# Patient Record
Sex: Male | Born: 1990 | Race: White | Hispanic: No | Marital: Single | Smoking: Current every day smoker
Health system: Southern US, Community
[De-identification: ages and names within clinical notes are randomized; demographics above are authoritative.]

## PROBLEM LIST (undated history)

## (undated) DIAGNOSIS — S62309A Unspecified fracture of unspecified metacarpal bone, initial encounter for closed fracture: Secondary | ICD-10-CM

## (undated) DIAGNOSIS — J45909 Unspecified asthma, uncomplicated: Secondary | ICD-10-CM

## (undated) DIAGNOSIS — F32A Depression, unspecified: Secondary | ICD-10-CM

## (undated) DIAGNOSIS — L409 Psoriasis, unspecified: Secondary | ICD-10-CM

## (undated) DIAGNOSIS — F419 Anxiety disorder, unspecified: Secondary | ICD-10-CM

## (undated) DIAGNOSIS — F329 Major depressive disorder, single episode, unspecified: Secondary | ICD-10-CM

## (undated) HISTORY — PX: TONSILLECTOMY: SUR1361

---
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
## 2008-07-24 ENCOUNTER — Observation Stay (HOSPITAL_COMMUNITY): Admission: EM | Admit: 2008-07-24 | Discharge: 2008-07-24 | Payer: Self-pay | Admitting: Emergency Medicine

## 2008-07-25 ENCOUNTER — Emergency Department (HOSPITAL_COMMUNITY): Admission: EM | Admit: 2008-07-25 | Discharge: 2008-07-25 | Payer: Self-pay | Admitting: Emergency Medicine

## 2009-10-30 ENCOUNTER — Emergency Department (HOSPITAL_COMMUNITY): Admission: EM | Admit: 2009-10-30 | Discharge: 2009-10-30 | Payer: Self-pay | Admitting: Emergency Medicine

## 2010-01-15 ENCOUNTER — Emergency Department (HOSPITAL_COMMUNITY): Admission: EM | Admit: 2010-01-15 | Discharge: 2010-01-15 | Payer: Self-pay | Admitting: Emergency Medicine
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
## 2011-12-16 ENCOUNTER — Emergency Department (HOSPITAL_COMMUNITY): Payer: Self-pay

## 2011-12-16 ENCOUNTER — Emergency Department (HOSPITAL_COMMUNITY)
Admission: EM | Admit: 2011-12-16 | Discharge: 2011-12-16 | Disposition: A | Discharge status: Home / Self Care | Payer: Self-pay | Attending: Emergency Medicine | Admitting: Emergency Medicine

## 2011-12-16 ENCOUNTER — Encounter (HOSPITAL_COMMUNITY): Payer: Self-pay | Admitting: Emergency Medicine

## 2011-12-16 DIAGNOSIS — J45901 Unspecified asthma with (acute) exacerbation: Secondary | ICD-10-CM | POA: Insufficient documentation

## 2011-12-16 HISTORY — DX: Unspecified asthma, uncomplicated: J45.909

## 2011-12-16 MED ORDER — HYDROCODONE-ACETAMINOPHEN 5-325 MG PO TABS: 2.0000 | ORAL_TABLET | Freq: Four times a day (QID) | ORAL | Status: DC | PRN

## 2011-12-16 MED ORDER — ALBUTEROL SULFATE (5 MG/ML) 0.5% IN NEBU
5.0000 mg | INHALATION_SOLUTION | Freq: Once | RESPIRATORY_TRACT | Status: AC
  Administered 2011-12-16: 5 mg via RESPIRATORY_TRACT

## 2011-12-16 MED ORDER — IPRATROPIUM BROMIDE 0.02 % IN SOLN
0.5000 mg | Freq: Once | RESPIRATORY_TRACT | Status: AC
  Administered 2011-12-16: 0.5 mg via RESPIRATORY_TRACT

## 2011-12-16 MED ORDER — ALBUTEROL SULFATE (2.5 MG/3ML) 0.083% IN NEBU: 2.5000 mg | INHALATION_SOLUTION | RESPIRATORY_TRACT | Status: DC | PRN

## 2011-12-16 MED ORDER — PREDNISONE 20 MG PO TABS: ORAL_TABLET | ORAL | Status: DC

## 2011-12-16 MED ORDER — BUDESONIDE 0.5 MG/2ML IN SUSP: 0.5000 mg | Freq: Two times a day (BID) | RESPIRATORY_TRACT | Status: DC

## 2011-12-16 MED ORDER — IPRATROPIUM BROMIDE 0.02 % IN SOLN
RESPIRATORY_TRACT | Status: AC
  Administered 2011-12-16: 0.5 mg via RESPIRATORY_TRACT
  Filled 2011-12-16: qty 2.5

## 2011-12-16 MED ORDER — ALBUTEROL SULFATE (5 MG/ML) 0.5% IN NEBU
INHALATION_SOLUTION | RESPIRATORY_TRACT | Status: AC
  Administered 2011-12-16: 5 mg via RESPIRATORY_TRACT
  Filled 2011-12-16: qty 1

## 2011-12-16 MED ORDER — ALBUTEROL SULFATE HFA 108 (90 BASE) MCG/ACT IN AERS: 2.0000 | INHALATION_SPRAY | RESPIRATORY_TRACT | Status: DC | PRN

## 2011-12-16 NOTE — ED Notes (Signed)
Pt c/o increased SOB and  Cough with URI sx; pt sts some pain with cough

## 2011-12-16 NOTE — ED Provider Notes (Signed)
History   This chart was scribed for Hurman Horn, MD by Albertha Ghee Rifaie. This patient was seen in room TR10C/TR10C and the patient's care was started at 4:01PM.   CSN: [id number]  Arrival date & time 12/16/11  1331   None     Chief Complaint  Patient presents with  . Shortness of Breath  . Cough     The history is provided by the patient. No language interpreter was used.   [name] is a 21 y.o. male who presents to the Emergency Department complaining of several days of gradual onset, gradually worsening, sever SOB that is associated w/ Cp, cough, wheezing, and generalized body fatigue. Pt states having a h/o asthma. He states taking an inhaler that is minimally helping his symptoms and he ran out of it for a while now. He repots having an access to a nebulizer at home. Pt reports having these symptoms before but the CP is new. He denies fever, emesis, chills, and nausea. Pt is a current everyday smoker and occasional alcohol user.        Past Medical History  Diagnosis Date  . Asthma     History reviewed. No pertinent past surgical history.  History reviewed. No pertinent family history.  History  Substance Use Topics  . Smoking status: Current Every Day Smoker  . Smokeless tobacco: Not on file  . Alcohol Use: Yes      Review of Systems  10 Systems reviewed and all are negative for acute change except as noted in the HPI.  Allergies  Review of patient's allergies indicates no known allergies.  Home Medications   Current Outpatient Rx  Name Route Sig Dispense Refill  . ALBUTEROL SULFATE HFA 108 (90 BASE) MCG/ACT IN AERS Inhalation Inhale 2 puffs into the lungs every 6 (six) hours as needed. For asthma    . ALBUTEROL SULFATE HFA 108 (90 BASE) MCG/ACT IN AERS Inhalation Inhale 2 puffs into the lungs every 2 (two) hours as needed for wheezing or shortness of breath (cough). 1 Inhaler 0  . ALBUTEROL SULFATE (2.5 MG/3ML) 0.083% IN NEBU Nebulization Take 3  mLs (2.5 mg total) by nebulization every 4 (four) hours as needed for wheezing. 30 vial 0  . BUDESONIDE 0.5 MG/2ML IN SUSP Nebulization Take 2 mLs (0.5 mg total) by nebulization 2 (two) times daily. 60 mL 0  . HYDROCODONE-ACETAMINOPHEN 5-325 MG PO TABS Oral Take 2 tablets by mouth every 6 (six) hours as needed for pain. 10 tablet 0  . PREDNISONE 20 MG PO TABS  3 tabs po day one, then 2 po daily x 4 days 11 tablet 0    BP 112/63  Pulse 78  Temp 98.5 F (36.9 C) (Oral)  Resp 14  SpO2 97%  Physical Exam  Nursing note and vitals reviewed. Constitutional:       Awake, alert, nontoxic appearance.  HENT:  Head: Atraumatic.  Eyes: Right eye exhibits no discharge. Left eye exhibits no discharge.  Neck: Neck supple.  Cardiovascular: Normal rate and regular rhythm.   Pulmonary/Chest: Effort normal. He has wheezes. He exhibits no tenderness.       diffuse scatter expiratory wheezes   Abdominal: Soft. There is no tenderness. There is no rebound.  Musculoskeletal: He exhibits no tenderness.       Baseline ROM, no obvious new focal weakness.  Neurological:       Mental status and motor strength appears baseline for patient and situation.  Skin: No rash  noted.  Psychiatric: He has a normal mood and affect.    ED Course  Procedures (including critical care time)  Labs Reviewed - No data to display No results found. DIAGNOSTIC STUDIES: Oxygen Saturation is 97% on room air, adequate by my interpretation.    COORDINATION OF CARE: 4:01PM Patient / Family / Caregiver informed of clinical course, understand medical decision-making process, and agree with plan.     1. Asthma attack       MDM  I personally performed the services described in this documentation, which was scribed in my presence. The recorded information has been reviewed and considered.   Hurman Horn, MD 12/20/11 [phone]

## 2012-02-13 ENCOUNTER — Emergency Department (HOSPITAL_COMMUNITY): Payer: Self-pay

## 2012-02-13 ENCOUNTER — Other Ambulatory Visit: Payer: Self-pay | Admitting: Orthopedic Surgery

## 2012-02-13 ENCOUNTER — Encounter (HOSPITAL_COMMUNITY): Payer: Self-pay | Admitting: Emergency Medicine

## 2012-02-13 ENCOUNTER — Encounter (HOSPITAL_BASED_OUTPATIENT_CLINIC_OR_DEPARTMENT_OTHER): Payer: Self-pay | Admitting: *Deleted

## 2012-02-13 ENCOUNTER — Emergency Department (HOSPITAL_COMMUNITY)
Admission: EM | Admit: 2012-02-13 | Discharge: 2012-02-13 | Disposition: A | Discharge status: Home / Self Care | Payer: Self-pay | Attending: Emergency Medicine | Admitting: Emergency Medicine

## 2012-02-13 DIAGNOSIS — Y929 Unspecified place or not applicable: Secondary | ICD-10-CM | POA: Insufficient documentation

## 2012-02-13 DIAGNOSIS — S62308A Unspecified fracture of other metacarpal bone, initial encounter for closed fracture: Secondary | ICD-10-CM

## 2012-02-13 DIAGNOSIS — F17200 Nicotine dependence, unspecified, uncomplicated: Secondary | ICD-10-CM | POA: Insufficient documentation

## 2012-02-13 DIAGNOSIS — Y9389 Activity, other specified: Secondary | ICD-10-CM | POA: Insufficient documentation

## 2012-02-13 DIAGNOSIS — L408 Other psoriasis: Secondary | ICD-10-CM | POA: Insufficient documentation

## 2012-02-13 DIAGNOSIS — S62309A Unspecified fracture of unspecified metacarpal bone, initial encounter for closed fracture: Secondary | ICD-10-CM

## 2012-02-13 DIAGNOSIS — M7989 Other specified soft tissue disorders: Secondary | ICD-10-CM | POA: Insufficient documentation

## 2012-02-13 DIAGNOSIS — J45909 Unspecified asthma, uncomplicated: Secondary | ICD-10-CM | POA: Insufficient documentation

## 2012-02-13 DIAGNOSIS — Z79899 Other long term (current) drug therapy: Secondary | ICD-10-CM | POA: Insufficient documentation

## 2012-02-13 DIAGNOSIS — W010XXA Fall on same level from slipping, tripping and stumbling without subsequent striking against object, initial encounter: Secondary | ICD-10-CM | POA: Insufficient documentation

## 2012-02-13 DIAGNOSIS — S62319A Displaced fracture of base of unspecified metacarpal bone, initial encounter for closed fracture: Secondary | ICD-10-CM | POA: Insufficient documentation

## 2012-02-13 HISTORY — DX: Unspecified fracture of unspecified metacarpal bone, initial encounter for closed fracture: S62.309A

## 2012-02-13 HISTORY — DX: Psoriasis, unspecified: L40.9

## 2012-02-13 MED ORDER — HYDROCODONE-ACETAMINOPHEN 5-325 MG PO TABS
1.0000 | ORAL_TABLET | Freq: Once | ORAL | Status: AC
  Administered 2012-02-13: 1 via ORAL
  Filled 2012-02-13: qty 1

## 2012-02-13 MED ORDER — HYDROCODONE-ACETAMINOPHEN 5-325 MG PO TABS: 1.0000 | ORAL_TABLET | Freq: Four times a day (QID) | ORAL | Status: DC | PRN

## 2012-02-13 NOTE — ED Provider Notes (Signed)
History     CSN: [id number]  Arrival date & time 02/13/12  4098   First MD Initiated Contact with Patient 02/13/12 0757      Chief Complaint  Patient presents with  . Hand Injury    (Consider location/radiation/quality/duration/timing/severity/associated sxs/prior treatment) HPI Comments: 21 year old boy that is right-hand dominant presents emergency department complaining of right hand pain.  Patient reports an incident just prior to arrival that he slipped going down stairs with point of impact being on a closed fist, right hand.  Patient denies any numbness or tingling of the digits, inability to move fingers, or wrist.  Patient reports swelling and pain.  Denies blood loss at fall.  Symptoms are moderate.  No other complaints this time.  Patient is a 21 y.o. male presenting with hand injury.  Hand Injury  Pertinent negatives include no fever.    Past Medical History  Diagnosis Date  . Asthma   . Psoriasis     History reviewed. No pertinent past surgical history.  No family history on file.  History  Substance Use Topics  . Smoking status: Current Every Day Smoker -- 1.0 packs/day    Types: Cigarettes  . Smokeless tobacco: Not on file  . Alcohol Use: Yes      Review of Systems  Constitutional: Negative for fever, diaphoresis and activity change.  HENT: Negative for congestion and neck pain.   Respiratory: Negative for cough.   Genitourinary: Negative for dysuria.  Musculoskeletal: Negative for myalgias.  Skin: Negative for color change and wound.  Neurological: Negative for headaches.  All other systems reviewed and are negative.    Allergies  Review of patient's allergies indicates no known allergies.  Home Medications   Current Outpatient Rx  Name  Route  Sig  Dispense  Refill  . ACETAMINOPHEN 500 MG PO TABS   Oral   Take 1,000 mg by mouth every 6 (six) hours as needed. For pain/fever         . ALBUTEROL SULFATE HFA 108 (90 BASE) MCG/ACT IN  AERS   Inhalation   Inhale 2 puffs into the lungs every 2 (two) hours as needed. For shortness of breath         . ALBUTEROL SULFATE (2.5 MG/3ML) 0.083% IN NEBU   Nebulization   Take 2.5 mg by nebulization every 4 (four) hours as needed. For shortness of breath           BP 122/71  Pulse 70  Temp 97.8 F (36.6 C) (Oral)  Resp 18  Ht 5\' 6"  (1.676 m)  Wt 125 lb (56.7 kg)  BMI 20.18 kg/m2  SpO2 99%  Physical Exam  Nursing note and vitals reviewed. Constitutional: He appears well-developed and well-nourished. No distress.  HENT:  Head: Normocephalic and atraumatic.  Eyes: Conjunctivae normal and EOM are normal.  Neck: Normal range of motion. Neck supple.  Cardiovascular:       Intact distal pulses, capillary refill < 3 seconds  Musculoskeletal:       Pain w right hand finger movement. Bony ttp along 4th & 5th metacarpals. No small finger medial deviation in flexion. All other extremities with normal ROM  Neurological:       No sensory deficit  Skin: He is not diaphoretic.       Swelling over dorsal surface of metacarpal area, right hand. Skin intact, no tenting. Bruising on palmer surface    ED Course  Procedures (including critical care time)  Labs Reviewed - No data  to display Dg Wrist Complete Right  02/13/2012  *RADIOLOGY REPORT*  Clinical Data: Fall, medial wrist pain  RIGHT WRIST - COMPLETE 3+ VIEW  Comparison: None.  Findings: Fracture at the base of the fifth metacarpal.  No fracture or dislocation is seen in the wrist.  The joint spaces are preserved.  Possible mild soft tissue swelling along the dorsal wrist.  IMPRESSION: Fracture at the base of the fifth metacarpal.   Original Report Authenticated By: Charline Bills, M.D.    Dg Hand Complete Right  02/13/2012  *RADIOLOGY REPORT*  Clinical Data: Fall, right hand injury  RIGHT HAND - COMPLETE 3+ VIEW  Comparison: None.  Findings: Mildly displaced intra-articular fracture at the base of the fifth metacarpal.   No additional fracture is seen.  The joint spaces are preserved.  Possible mild soft tissue swelling along the medial hand.  IMPRESSION: Mildly displaced fracture at the base of the fifth metacarpal.   Original Report Authenticated By: Charline Bills, M.D.      No diagnosis found.  Consult: Hand- will call pt today to be evaluated for possible pinning to be done monday. Pt splinted in ER & dc w Rx for pain  MDM  %th metacarpal fracture, Mildly displaced 21 year old boy presented to ER after mechanical fall this morning with fifth metacarpal mildly displaced fracture.  Neurovascularly intact on exam.  Consult to hand as above.  Patient's pain managed well in the emergency department.  Splint applied.  Patient is to followup with orthopedics today outpatient appointment and evaluation for need of surgery.   At this time there does not appear to be any evidence of an acute emergency medical condition and the patient appears stable for discharge with appropriate outpatient follow up.Diagnosis was discussed with patient who verbalizes understanding and is agreeable to discharge. Pt case discussed with Dr. Adriana Simas who agrees with my plan.         Jaci Carrel, [place] 02/13/12 1037

## 2012-02-13 NOTE — Progress Notes (Signed)
Orthopedic Tech Progress Note Patient Details:  GAD AYMOND [birth year] [id number] Ulna-gutter splint applied to Right UE. Patient tolerated application well. Patient was asked upon application if splint had gotten too hot or if it was wrapped too tight and patient replied no. Ortho tech noticed that splint was mad a little long and stopped just above the elbow. Patient was asked if he would like splint to be cut shorter and he replied that it was ok where it is. Note that patient has full ROM on elbow joint and splint did not bother patient at bend of elbow. ER doctor present to speak with patient about setting up appointment with hand surgeon to evaluate. Ortho Devices Type of Ortho Device: Ulna gutter splint Ortho Device/Splint Location: Right UE Ortho Device/Splint Interventions: Application   Asia R Thompson 02/13/2012, 10:38 AM

## 2012-02-13 NOTE — ED Notes (Signed)
Patient states "slipped coming down steps and landed on hand.  I think I broke it".

## 2012-02-13 NOTE — ED Notes (Signed)
Ortho called to place splint. 

## 2012-02-16 ENCOUNTER — Encounter (HOSPITAL_BASED_OUTPATIENT_CLINIC_OR_DEPARTMENT_OTHER): Payer: Self-pay | Admitting: Certified Registered"

## 2012-02-16 ENCOUNTER — Encounter (HOSPITAL_BASED_OUTPATIENT_CLINIC_OR_DEPARTMENT_OTHER): Payer: Self-pay

## 2012-02-16 ENCOUNTER — Encounter (HOSPITAL_BASED_OUTPATIENT_CLINIC_OR_DEPARTMENT_OTHER)
Admission: RE | Disposition: A | Discharge status: Home / Self Care | Payer: Self-pay | Source: Ambulatory Visit | Attending: Orthopedic Surgery

## 2012-02-16 ENCOUNTER — Ambulatory Visit (HOSPITAL_BASED_OUTPATIENT_CLINIC_OR_DEPARTMENT_OTHER): Payer: Self-pay | Admitting: Certified Registered"

## 2012-02-16 ENCOUNTER — Ambulatory Visit (HOSPITAL_BASED_OUTPATIENT_CLINIC_OR_DEPARTMENT_OTHER)
Admission: RE | Admit: 2012-02-16 | Discharge: 2012-02-16 | Disposition: A | Discharge status: Home / Self Care | Payer: Self-pay | Source: Ambulatory Visit | Attending: Orthopedic Surgery | Admitting: Orthopedic Surgery

## 2012-02-16 DIAGNOSIS — S62309A Unspecified fracture of unspecified metacarpal bone, initial encounter for closed fracture: Secondary | ICD-10-CM

## 2012-02-16 DIAGNOSIS — F17200 Nicotine dependence, unspecified, uncomplicated: Secondary | ICD-10-CM | POA: Insufficient documentation

## 2012-02-16 DIAGNOSIS — R296 Repeated falls: Secondary | ICD-10-CM | POA: Insufficient documentation

## 2012-02-16 DIAGNOSIS — L408 Other psoriasis: Secondary | ICD-10-CM | POA: Insufficient documentation

## 2012-02-16 DIAGNOSIS — J45909 Unspecified asthma, uncomplicated: Secondary | ICD-10-CM | POA: Insufficient documentation

## 2012-02-16 DIAGNOSIS — S62233A Other displaced fracture of base of first metacarpal bone, unspecified hand, initial encounter for closed fracture: Secondary | ICD-10-CM | POA: Insufficient documentation

## 2012-02-16 HISTORY — DX: Anxiety disorder, unspecified: F41.9

## 2012-02-16 HISTORY — DX: Unspecified fracture of unspecified metacarpal bone, initial encounter for closed fracture: S62.309A

## 2012-02-16 HISTORY — PX: CLOSED REDUCTION FINGER WITH PERCUTANEOUS PINNING: SHX5612

## 2012-02-16 LAB — POCT HEMOGLOBIN-HEMACUE: Hemoglobin: 14.6 g/dL (ref 13.0–17.0)

## 2012-02-16 SURGERY — CLOSED REDUCTION, FINGER, WITH PERCUTANEOUS PINNING
Anesthesia: General | Laterality: Right

## 2012-02-16 MED ORDER — HYDROMORPHONE HCL PF 1 MG/ML IJ SOLN
0.2500 mg | INTRAMUSCULAR | Status: DC | PRN
Start: 2012-02-16 — End: 2012-02-16
  Administered 2012-02-16 (×3): 0.5 mg via INTRAVENOUS

## 2012-02-16 MED ORDER — DEXAMETHASONE SODIUM PHOSPHATE 4 MG/ML IJ SOLN
INTRAMUSCULAR | Status: DC | PRN
  Administered 2012-02-16: 8 mg via INTRAVENOUS

## 2012-02-16 MED ORDER — OXYCODONE HCL 5 MG PO TABS
5.0000 mg | ORAL_TABLET | Freq: Once | ORAL | Status: AC | PRN
  Administered 2012-02-16: 5 mg via ORAL

## 2012-02-16 MED ORDER — ONDANSETRON HCL 4 MG/2ML IJ SOLN: 4.0000 mg | Freq: Once | INTRAMUSCULAR | Status: DC | PRN

## 2012-02-16 MED ORDER — OXYCODONE HCL 5 MG/5ML PO SOLN: 5.0000 mg | Freq: Once | ORAL | Status: AC | PRN

## 2012-02-16 MED ORDER — LACTATED RINGERS IV SOLN
INTRAVENOUS | Status: DC
  Administered 2012-02-16: 07:00:00 via INTRAVENOUS

## 2012-02-16 MED ORDER — PROPOFOL 10 MG/ML IV BOLUS
INTRAVENOUS | Status: DC | PRN
  Administered 2012-02-16: 300 mg via INTRAVENOUS

## 2012-02-16 MED ORDER — BUPIVACAINE HCL (PF) 0.25 % IJ SOLN
INTRAMUSCULAR | Status: DC | PRN
  Administered 2012-02-16: 3 mL

## 2012-02-16 MED ORDER — FENTANYL CITRATE 0.05 MG/ML IJ SOLN
INTRAMUSCULAR | Status: DC | PRN
  Administered 2012-02-16: 100 ug via INTRAVENOUS

## 2012-02-16 MED ORDER — CHLORHEXIDINE GLUCONATE 4 % EX LIQD: 60.0000 mL | Freq: Once | CUTANEOUS | Status: DC

## 2012-02-16 MED ORDER — MIDAZOLAM HCL 5 MG/5ML IJ SOLN
INTRAMUSCULAR | Status: DC | PRN
  Administered 2012-02-16: 2 mg via INTRAVENOUS

## 2012-02-16 MED ORDER — ONDANSETRON HCL 4 MG/2ML IJ SOLN
INTRAMUSCULAR | Status: DC | PRN
  Administered 2012-02-16: 4 mg via INTRAVENOUS

## 2012-02-16 MED ORDER — OXYCODONE-ACETAMINOPHEN 5-325 MG PO TABS
1.0000 | ORAL_TABLET | ORAL | Status: DC | PRN
Start: 2012-02-16 — End: 2012-07-29

## 2012-02-16 MED ORDER — LIDOCAINE HCL (CARDIAC) 20 MG/ML IV SOLN
INTRAVENOUS | Status: DC | PRN
  Administered 2012-02-16: 75 mg via INTRAVENOUS

## 2012-02-16 MED ORDER — CEFAZOLIN SODIUM-DEXTROSE 2-3 GM-% IV SOLR
2.0000 g | INTRAVENOUS | Status: AC
  Administered 2012-02-16: 2 g via INTRAVENOUS

## 2012-02-16 SURGICAL SUPPLY — 59 items
APL SKNCLS STERI-STRIP NONHPOA (GAUZE/BANDAGES/DRESSINGS)
BANDAGE ELASTIC 3 VELCRO ST LF (GAUZE/BANDAGES/DRESSINGS) ×2 IMPLANT
BANDAGE ELASTIC 4 VELCRO ST LF (GAUZE/BANDAGES/DRESSINGS) IMPLANT
BANDAGE GAUZE ELAST BULKY 4 IN (GAUZE/BANDAGES/DRESSINGS) ×1 IMPLANT
BENZOIN TINCTURE PRP APPL 2/3 (GAUZE/BANDAGES/DRESSINGS) IMPLANT
BLADE SURG 15 STRL LF DISP TIS (BLADE) ×1 IMPLANT
BLADE SURG 15 STRL SS (BLADE) ×2
BNDG CMPR 9X4 STRL LF SNTH (GAUZE/BANDAGES/DRESSINGS)
BNDG CMPR MD 5X2 ELC HKLP STRL (GAUZE/BANDAGES/DRESSINGS) ×1
BNDG COHESIVE 1X5 TAN STRL LF (GAUZE/BANDAGES/DRESSINGS) IMPLANT
BNDG ELASTIC 2 VLCR STRL LF (GAUZE/BANDAGES/DRESSINGS) ×1 IMPLANT
BNDG ESMARK 4X9 LF (GAUZE/BANDAGES/DRESSINGS) IMPLANT
CANISTER SUCTION 1200CC (MISCELLANEOUS) IMPLANT
CLOTH BEACON ORANGE TIMEOUT ST (SAFETY) ×2 IMPLANT
CORDS BIPOLAR (ELECTRODE) IMPLANT
COVER TABLE BACK 60X90 (DRAPES) ×2 IMPLANT
CUFF TOURNIQUET SINGLE 18IN (TOURNIQUET CUFF) ×1 IMPLANT
DECANTER SPIKE VIAL GLASS SM (MISCELLANEOUS) IMPLANT
DRAPE EXTREMITY T 121X128X90 (DRAPE) ×2 IMPLANT
DRAPE OEC MINIVIEW 54X84 (DRAPES) ×2 IMPLANT
DRAPE SURG 17X23 STRL (DRAPES) ×2 IMPLANT
DURAPREP 26ML APPLICATOR (WOUND CARE) ×2 IMPLANT
GAUZE SPONGE 4X4 16PLY XRAY LF (GAUZE/BANDAGES/DRESSINGS) IMPLANT
GAUZE XEROFORM 1X8 LF (GAUZE/BANDAGES/DRESSINGS) ×1 IMPLANT
GLOVE BIO SURGEON STRL SZ 6.5 (GLOVE) ×1 IMPLANT
GLOVE BIO SURGEON STRL SZ8.5 (GLOVE) ×2 IMPLANT
GOWN PREVENTION PLUS XLARGE (GOWN DISPOSABLE) ×2 IMPLANT
GOWN PREVENTION PLUS XXLARGE (GOWN DISPOSABLE) ×2 IMPLANT
K-WIRE .045X4 (WIRE) ×2 IMPLANT
NDL HYPO 25X1 1.5 SAFETY (NEEDLE) IMPLANT
NEEDLE HYPO 25X1 1.5 SAFETY (NEEDLE) ×2 IMPLANT
NS IRRIG 1000ML POUR BTL (IV SOLUTION) IMPLANT
PACK BASIN DAY SURGERY FS (CUSTOM PROCEDURE TRAY) ×2 IMPLANT
PAD CAST 3X4 CTTN HI CHSV (CAST SUPPLIES) ×1 IMPLANT
PAD CAST 4YDX4 CTTN HI CHSV (CAST SUPPLIES) IMPLANT
PADDING CAST ABS 4INX4YD NS (CAST SUPPLIES) ×1
PADDING CAST ABS COTTON 4X4 ST (CAST SUPPLIES) ×1 IMPLANT
PADDING CAST COTTON 3X4 STRL (CAST SUPPLIES) ×2
PADDING CAST COTTON 4X4 STRL (CAST SUPPLIES) ×2
PADDING UNDERCAST 2  STERILE (CAST SUPPLIES) ×2 IMPLANT
SHEET MEDIUM DRAPE 40X70 STRL (DRAPES) ×2 IMPLANT
SPLINT PLASTER CAST XFAST 4X15 (CAST SUPPLIES) IMPLANT
SPLINT PLASTER XTRA FAST SET 4 (CAST SUPPLIES) ×15
SPONGE GAUZE 4X4 12PLY (GAUZE/BANDAGES/DRESSINGS) ×2 IMPLANT
STOCKINETTE 4X48 STRL (DRAPES) ×2 IMPLANT
STRIP CLOSURE SKIN 1/2X4 (GAUZE/BANDAGES/DRESSINGS) IMPLANT
SUCTION FRAZIER TIP 10 FR DISP (SUCTIONS) IMPLANT
SUT ETHILON 4 0 PS 2 18 (SUTURE) IMPLANT
SUT ETHILON 5 0 PS 2 18 (SUTURE) IMPLANT
SUT MERSILENE 4 0 P 3 (SUTURE) IMPLANT
SUT VIC AB 4-0 P-3 18XBRD (SUTURE) IMPLANT
SUT VIC AB 4-0 P3 18 (SUTURE)
SUT VICRYL RAPIDE 4/0 PS 2 (SUTURE) IMPLANT
SYR BULB 3OZ (MISCELLANEOUS) IMPLANT
SYRINGE 10CC LL (SYRINGE) ×1 IMPLANT
TOWEL OR 17X24 6PK STRL BLUE (TOWEL DISPOSABLE) ×2 IMPLANT
TUBE CONNECTING 20X1/4 (TUBING) IMPLANT
UNDERPAD 30X30 INCONTINENT (UNDERPADS AND DIAPERS) ×2 IMPLANT
WATER STERILE IRR 1000ML POUR (IV SOLUTION) ×1 IMPLANT

## 2012-02-16 NOTE — Op Note (Signed)
See note [id number]

## 2012-02-16 NOTE — H&P (Signed)
[name] is an 21 y.o. male.   Chief Complaint: right hand pain HPI: as above s/p fall onto right hand with displaced right small metacarpal base fracture  Past Medical History  Diagnosis Date  . Psoriasis     right foot  . Asthma     triggered by cold weather; prn inhaler  . Anxiety     no current med.  . Fracture of metacarpal 02/13/2012    right small    Past Surgical History  Procedure Date  . Tonsillectomy age 21    History reviewed. No pertinent family history. Social History:  reports that he has been smoking Cigarettes.  He has a 6 pack-year smoking history. He has never used smokeless tobacco. He reports that he drinks alcohol. He reports that he does not use illicit drugs.  Allergies: No Known Allergies  Medications Prior to Admission  Medication Sig Dispense Refill  . albuterol (PROVENTIL HFA;VENTOLIN HFA) 108 (90 BASE) MCG/ACT inhaler Inhale 2 puffs into the lungs every 2 (two) hours as needed. For shortness of breath      . HYDROcodone-acetaminophen (NORCO/VICODIN) 5-325 MG per tablet Take 1 tablet by mouth every 6 (six) hours as needed for pain.  15 tablet  0    No results found for this or any previous visit (from the past 48 hour(s)). No results found.  Review of Systems  All other systems reviewed and are negative.    Blood pressure 113/74, pulse 60, temperature 98.1 F (36.7 C), temperature source Oral, resp. rate 20, height 5\' 6"  (1.676 m), weight 55.52 kg (122 lb 6.4 oz), SpO2 99.00%. Physical Exam  Constitutional: He is oriented to person, place, and time. He appears well-developed and well-nourished.  HENT:  Head: Normocephalic and atraumatic.  Cardiovascular: Normal rate.   Respiratory: Effort normal.  Musculoskeletal:       Right hand: He exhibits bony tenderness and deformity.  Neurological: He is alert and oriented to person, place, and time.  Skin: Skin is warm.  Psychiatric: He has a normal mood and affect. His behavior is normal.  Judgment and thought content normal.     Assessment/Plan As above   Plan closed reduction and pinning of above  Kassia Demarinis A 02/16/2012, 7:05 AM

## 2012-02-16 NOTE — Anesthesia Procedure Notes (Signed)
Procedure Name: LMA Insertion Date/Time: 02/16/2012 7:24 AM Performed by: Verlan Friends Pre-anesthesia Checklist: Patient identified, Emergency Drugs available, Suction available, Patient being monitored and Timeout performed Patient Re-evaluated:Patient Re-evaluated prior to inductionOxygen Delivery Method: Circle System Utilized Preoxygenation: Pre-oxygenation with 100% oxygen Intubation Type: IV induction Ventilation: Mask ventilation without difficulty LMA: LMA inserted LMA Size: 4.0 Number of attempts: 1 Airway Equipment and Method: bite block Placement Confirmation: positive ETCO2 Tube secured with: Tape Dental Injury: Teeth and Oropharynx as per pre-operative assessment

## 2012-02-16 NOTE — Brief Op Note (Signed)
02/16/2012  7:46 AM  PATIENT:  [name]  21 y.o. male  PRE-OPERATIVE DIAGNOSIS:  right.small metacarpal fracture  POST-OPERATIVE DIAGNOSIS:  right small metacarpal fracture  PROCEDURE:  Procedure(s) (LRB) with comments: CLOSED REDUCTION FINGER WITH PERCUTANEOUS PINNING (Right) - CLOSED REDUCTION PINNING RT SMALL METACARPAL FX.  SURGEON:  Surgeon(s) and Role:    * Marlowe Shores, MD - Primary none PHYSICIAN ASSISTANT:   ASSISTANTS:  none  ANESTHESIA:   general  EBL:  Total I/O In: 500 [I.V.:500] Out: -   BLOOD ADMINISTERED:none  DRAINS: none   LOCAL MEDICATIONS USED:  MARCAINE   4cc  SPECIMEN:  No Specimen  DISPOSITION OF SPECIMEN:  N/A  COUNTS:  YES  TOURNIQUET:   Total Tourniquet Time Documented: Upper Arm (Right) - 9 minutes  DICTATION: .Other Dictation: Dictation Number [id number]  PLAN OF CARE: Discharge to home after PACU  PATIENT DISPOSITION:  PACU - hemodynamically stable.   Delay start of Pharmacological VTE agent (>24hrs) due to surgical blood loss or risk of bleeding: not applicable

## 2012-02-16 NOTE — Transfer of Care (Signed)
Immediate Anesthesia Transfer of Care Note  Patient: [name]  Procedure(s) Performed: Procedure(s) (LRB) with comments: CLOSED REDUCTION FINGER WITH PERCUTANEOUS PINNING (Right) - CLOSED REDUCTION PINNING RT SMALL METACARPAL FX.  Patient Location: PACU  Anesthesia Type:General  Level of Consciousness: sedated and unresponsive  Airway & Oxygen Therapy: Patient Spontanous Breathing and Patient connected to face mask oxygen  Post-op Assessment: Report given to PACU RN and Post -op Vital signs reviewed and stable  Post vital signs: Reviewed and stable  Complications: No apparent anesthesia complications

## 2012-02-16 NOTE — Op Note (Signed)
NAMELIBORIO, [name]                ACCOUNT NO.:  [id number]  MEDICAL RECORD NO.:  [id number]  LOCATION:                                 FACILITY:  PHYSICIAN:  Artist Pais. Samariya Rockhold, M.D.DATE OF BIRTH:  05/08/1990  DATE OF PROCEDURE:  02/16/2012 DATE OF DISCHARGE:                              OPERATIVE REPORT   PREOPERATIVE DIAGNOSIS:  Displaced reverse Bennett fracture, right small finger.  POSTOPERATIVE DIAGNOSIS:  Displaced reverse Bennett fracture, right small finger.  PROCEDURE:  Closed reduction, percutaneous pinning above.  SURGEON:  Artist Pais. Mina Marble, MD  ASSISTANT:  None.  ANESTHESIA:  General.  TOURNIQUET TIME:  8 minutes.  COMPLICATIONS:  No complications.  DRAINS:  No drains.  OPERATION:  The patient was taken to operating suite.  After induction of adequate general anesthesia, right upper extremity was prepped and draped in sterile fashion.  An Esmarch was used to exsanguinate the limb.  Tourniquet was inflated to 250 mmHg.  At this point, longitudinal traction to the small finger was applied as well as downward pressure at the carpometacarpal joint was reduced.  An 0.45 K-wire was driven from ulnar to radial across the fracture site into the base of the fourth metacarpal.  Under fluoroscopic guidance, intraoperative fluoroscopy revealed adequate reduction in AP, lateral, and oblique view.  The K- wire was cut outside the skin and bent upon itself.  Second K-wire was then driven distal to this.  Same procedure was performed.  The patient was then placed in sterile dressing, Xeroform, 4x4s, fluffs, and ulnar gutter splint.  The patient tolerated procedure well and went to the recovery room in stable fashion.     Artist Pais Mina Marble, M.D.     MAW/MEDQ  D:  02/16/2012  T:  02/16/2012  Job:  [id number]

## 2012-02-16 NOTE — Anesthesia Postprocedure Evaluation (Signed)
  Anesthesia Post-op Note  Patient: [name]  Procedure(s) Performed: Procedure(s) (LRB) with comments: CLOSED REDUCTION FINGER WITH PERCUTANEOUS PINNING (Right) - CLOSED REDUCTION PINNING RT SMALL METACARPAL FX.  Patient Location: PACU  Anesthesia Type:General  Level of Consciousness: sedated  Airway and Oxygen Therapy: Patient Spontanous Breathing  Post-op Pain: none  Post-op Assessment: Post-op Vital signs reviewed  Post-op Vital Signs: Reviewed  Complications: No apparent anesthesia complications

## 2012-02-16 NOTE — Anesthesia Preprocedure Evaluation (Signed)
Anesthesia Evaluation  Patient identified by MRN, date of birth, ID band Patient awake    Airway Mallampati: I TM Distance: >3 FB Neck ROM: Full    Dental  (+) Teeth Intact and Dental Advisory Given   Pulmonary Current Smoker,  breath sounds clear to auscultation        Cardiovascular Rhythm:Regular Rate:Normal     Neuro/Psych    GI/Hepatic   Endo/Other    Renal/GU      Musculoskeletal   Abdominal   Peds  Hematology   Anesthesia Other Findings   Reproductive/Obstetrics                           Anesthesia Physical Anesthesia Plan  ASA: I  Anesthesia Plan: General   Post-op Pain Management:    Induction: Intravenous  Airway Management Planned: LMA  Additional Equipment:   Intra-op Plan:   Post-operative Plan: Extubation in OR  Informed Consent: I have reviewed the patients History and Physical, chart, labs and discussed the procedure including the risks, benefits and alternatives for the proposed anesthesia with the patient or authorized representative who has indicated his/her understanding and acceptance.   Dental advisory given  Plan Discussed with: CRNA, Anesthesiologist and Surgeon  Anesthesia Plan Comments:         Anesthesia Quick Evaluation

## 2012-02-17 ENCOUNTER — Encounter (HOSPITAL_BASED_OUTPATIENT_CLINIC_OR_DEPARTMENT_OTHER): Payer: Self-pay | Admitting: Orthopedic Surgery

## 2012-02-17 NOTE — ED Provider Notes (Signed)
Medical screening examination/treatment/procedure(s) were conducted as a shared visit with non-physician practitioner(s) and myself.  I personally evaluated the patient during the encounter.  Boxer's fracture of right fifth metacarpal.  No vascular intact. Ulnar gutter splint  Donnetta Hutching, MD 02/17/12 [phone]

## 2012-02-18 ENCOUNTER — Ambulatory Visit: Payer: Self-pay | Attending: Orthopedic Surgery | Admitting: Occupational Therapy

## 2012-02-18 DIAGNOSIS — M25649 Stiffness of unspecified hand, not elsewhere classified: Secondary | ICD-10-CM | POA: Insufficient documentation

## 2012-02-18 DIAGNOSIS — M25549 Pain in joints of unspecified hand: Secondary | ICD-10-CM | POA: Insufficient documentation

## 2012-02-18 DIAGNOSIS — IMO0001 Reserved for inherently not codable concepts without codable children: Secondary | ICD-10-CM | POA: Insufficient documentation

## 2012-02-19 ENCOUNTER — Encounter: Payer: Self-pay | Admitting: Occupational Therapy

## 2012-03-02 ENCOUNTER — Ambulatory Visit: Payer: Self-pay | Admitting: Occupational Therapy

## 2012-07-29 ENCOUNTER — Encounter (HOSPITAL_COMMUNITY): Payer: Self-pay | Admitting: Emergency Medicine

## 2012-07-29 ENCOUNTER — Ambulatory Visit (INDEPENDENT_AMBULATORY_CARE_PROVIDER_SITE_OTHER): Payer: BC Managed Care – PPO | Admitting: Emergency Medicine

## 2012-07-29 ENCOUNTER — Emergency Department (HOSPITAL_COMMUNITY)
Admission: EM | Admit: 2012-07-29 | Discharge: 2012-07-29 | Disposition: A | Discharge status: Home / Self Care | Payer: BC Managed Care – PPO | Attending: Emergency Medicine | Admitting: Emergency Medicine

## 2012-07-29 VITALS — BP 100/62 | HR 51 | Temp 98.5°F | Resp 16 | Ht 65.5 in | Wt 122.0 lb

## 2012-07-29 DIAGNOSIS — Z79899 Other long term (current) drug therapy: Secondary | ICD-10-CM | POA: Insufficient documentation

## 2012-07-29 DIAGNOSIS — F418 Other specified anxiety disorders: Secondary | ICD-10-CM

## 2012-07-29 DIAGNOSIS — Z8781 Personal history of (healed) traumatic fracture: Secondary | ICD-10-CM | POA: Insufficient documentation

## 2012-07-29 DIAGNOSIS — F1010 Alcohol abuse, uncomplicated: Secondary | ICD-10-CM | POA: Insufficient documentation

## 2012-07-29 DIAGNOSIS — J45909 Unspecified asthma, uncomplicated: Secondary | ICD-10-CM | POA: Insufficient documentation

## 2012-07-29 DIAGNOSIS — F341 Dysthymic disorder: Secondary | ICD-10-CM

## 2012-07-29 DIAGNOSIS — F329 Major depressive disorder, single episode, unspecified: Secondary | ICD-10-CM

## 2012-07-29 DIAGNOSIS — F419 Anxiety disorder, unspecified: Secondary | ICD-10-CM

## 2012-07-29 DIAGNOSIS — Z872 Personal history of diseases of the skin and subcutaneous tissue: Secondary | ICD-10-CM | POA: Insufficient documentation

## 2012-07-29 DIAGNOSIS — F411 Generalized anxiety disorder: Secondary | ICD-10-CM | POA: Insufficient documentation

## 2012-07-29 DIAGNOSIS — F3289 Other specified depressive episodes: Secondary | ICD-10-CM | POA: Insufficient documentation

## 2012-07-29 DIAGNOSIS — F17200 Nicotine dependence, unspecified, uncomplicated: Secondary | ICD-10-CM | POA: Insufficient documentation

## 2012-07-29 HISTORY — DX: Major depressive disorder, single episode, unspecified: F32.9

## 2012-07-29 HISTORY — DX: Depression, unspecified: F32.A

## 2012-07-29 MED ORDER — LORAZEPAM 1 MG PO TABS: 1.0000 mg | ORAL_TABLET | Freq: Three times a day (TID) | ORAL | Status: DC | PRN

## 2012-07-29 MED ORDER — ESCITALOPRAM OXALATE 20 MG PO TABS: 20.0000 mg | ORAL_TABLET | Freq: Every day | ORAL | Status: DC

## 2012-07-29 MED ORDER — CITALOPRAM HYDROBROMIDE 20 MG PO TABS: 20.0000 mg | ORAL_TABLET | Freq: Every day | ORAL | Status: DC

## 2012-07-29 NOTE — ED Provider Notes (Signed)
History     CSN: [id number]  Arrival date & time 07/29/12  1440   First MD Initiated Contact with Patient 07/29/12 1449      No chief complaint on file.   (Consider location/radiation/quality/duration/timing/severity/associated sxs/prior treatment) HPI Comments: Presents to the ED for depression and anxiety. Reports that he suffered from depression when he was a teenager. But has not had any problems for the past few years until Tuesday 07/27/2012.  States he is not sure what happened, he feels like he "hit a wall" and began feeling very depressed and axious.  Patient currently works for a [employer] but has not been able to go to work. States he has no motivation or drive to complete his normal activities. Patient states his job is stressful but this has not changed since he started working there. Patient is currently a single father.  Admits to occasional EtOH use. Denies any illicit drug use. No SI/HI/AVH.  Patient saw a counselor as a teenager which he states helped him.  Patient does not think he needs inpatient treatment, but he would like to establish with a psychiatrist and/or counselor to discuss his issues said that he can "feel normal again."  The history is provided by the patient.    Past Medical History  Diagnosis Date  . Psoriasis     right foot  . Asthma     triggered by cold weather; prn inhaler  . Anxiety     no current med.  . Fracture of metacarpal 02/13/2012    right small    Past Surgical History  Procedure Laterality Date  . Tonsillectomy  age 75  . Closed reduction finger with percutaneous pinning  02/16/2012    Procedure: CLOSED REDUCTION FINGER WITH PERCUTANEOUS PINNING;  Surgeon: Marlowe Shores, MD;  Location: [facility];  Service: Orthopedics;  Laterality: Right;  CLOSED REDUCTION PINNING RT SMALL METACARPAL FX.    No family history on file.  History  Substance Use Topics  . Smoking status: Current Every Day Smoker -- 1.00  packs/day for 6 years    Types: Cigarettes  . Smokeless tobacco: Never Used  . Alcohol Use: Yes     Comment: occasionally      Review of Systems  Psychiatric/Behavioral:       Depression, anxiety  All other systems reviewed and are negative.    Allergies  Review of patient's allergies indicates no known allergies.  Home Medications   Current Outpatient Rx  Name  Route  Sig  Dispense  Refill  . albuterol (PROVENTIL HFA;VENTOLIN HFA) 108 (90 BASE) MCG/ACT inhaler   Inhalation   Inhale 2 puffs into the lungs every 2 (two) hours as needed. For shortness of breath           There were no vitals taken for this visit.  Physical Exam  Nursing note and vitals reviewed. Constitutional: He is oriented to person, place, and time. He appears well-developed and well-nourished.  HENT:  Head: Normocephalic and atraumatic.  Mouth/Throat: Oropharynx is clear and moist.  Eyes: Conjunctivae and EOM are normal. Pupils are equal, round, and reactive to light.  Neck: Normal range of motion.  Cardiovascular: Normal rate, regular rhythm and normal heart sounds.   Pulmonary/Chest: Effort normal and breath sounds normal.  Abdominal: Soft. Bowel sounds are normal. There is no tenderness. There is no guarding.  Musculoskeletal: Normal range of motion.  Neurological: He is alert and oriented to person, place, and time.  Skin: Skin is  warm and dry.  Psychiatric: His speech is normal and behavior is normal. He expresses no homicidal and no suicidal ideation. He expresses no suicidal plans and no homicidal plans.  Flat affect, depressed mood    ED Course  Procedures (including critical care time)  Labs Reviewed - No data to display No results found.   1. Depression   2. Anxiety       MDM   22 year old male with increased depression and anxiety for the past 2 days. Denies any SI/HI/AVH.  Patient does not meet criteria for inpatient psychiatric treatment. Discussed pt with Dr. Patria Mane,  advised to starton Celexa 20 mg daily. Rx ativan PRN breakthrough anxiety.  He will be referred to outpatient behavioral health. Resource guide given for additional services if needed. Discussed plan with patient, he agreed.  Strict return precautions advised for increased depression/anxiety or thoughts of suicide/homicide.    Garlon Hatchet, PA-C 07/30/12 1104
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
## 2012-07-29 NOTE — ED Notes (Signed)
Per pt, started having depressive symptoms on Tuesday, unmotivated, family/work stressors-has been treated in past for depression when he was a teenager

## 2012-07-30 NOTE — ED Provider Notes (Signed)
Medical screening examination/treatment/procedure(s) were performed by non-physician practitioner and as supervising physician I was immediately available for consultation/collaboration.    Blaze Nylund R Rio Taber, MD 07/30/12 1517 

## 2012-09-14 ENCOUNTER — Ambulatory Visit (HOSPITAL_COMMUNITY): Payer: BC Managed Care – PPO | Admitting: Physician Assistant

## 2012-09-15 ENCOUNTER — Encounter (HOSPITAL_COMMUNITY): Payer: Self-pay

## 2012-09-17 ENCOUNTER — Emergency Department (HOSPITAL_COMMUNITY)
Admission: EM | Admit: 2012-09-17 | Discharge: 2012-09-17 | Disposition: A | Discharge status: Other Acute Inpatient Hospital | Payer: BC Managed Care – PPO | Attending: Emergency Medicine | Admitting: Emergency Medicine

## 2012-09-17 ENCOUNTER — Inpatient Hospital Stay (HOSPITAL_COMMUNITY)
Admission: AD | Admit: 2012-09-17 | Discharge: 2012-09-21 | DRG: 430 | Disposition: A | Discharge status: Home / Self Care | Payer: BC Managed Care – PPO | Source: Intra-hospital | Attending: Psychiatry | Admitting: Psychiatry

## 2012-09-17 ENCOUNTER — Encounter (HOSPITAL_COMMUNITY): Payer: Self-pay | Admitting: *Deleted

## 2012-09-17 DIAGNOSIS — J45909 Unspecified asthma, uncomplicated: Secondary | ICD-10-CM | POA: Diagnosis present

## 2012-09-17 DIAGNOSIS — F1210 Cannabis abuse, uncomplicated: Secondary | ICD-10-CM

## 2012-09-17 DIAGNOSIS — Z8781 Personal history of (healed) traumatic fracture: Secondary | ICD-10-CM | POA: Insufficient documentation

## 2012-09-17 DIAGNOSIS — Z79899 Other long term (current) drug therapy: Secondary | ICD-10-CM | POA: Insufficient documentation

## 2012-09-17 DIAGNOSIS — F411 Generalized anxiety disorder: Secondary | ICD-10-CM

## 2012-09-17 DIAGNOSIS — Z8659 Personal history of other mental and behavioral disorders: Secondary | ICD-10-CM | POA: Insufficient documentation

## 2012-09-17 DIAGNOSIS — L408 Other psoriasis: Secondary | ICD-10-CM | POA: Diagnosis present

## 2012-09-17 DIAGNOSIS — F329 Major depressive disorder, single episode, unspecified: Secondary | ICD-10-CM

## 2012-09-17 DIAGNOSIS — F332 Major depressive disorder, recurrent severe without psychotic features: Principal | ICD-10-CM | POA: Diagnosis present

## 2012-09-17 DIAGNOSIS — Z872 Personal history of diseases of the skin and subcutaneous tissue: Secondary | ICD-10-CM | POA: Insufficient documentation

## 2012-09-17 DIAGNOSIS — F17200 Nicotine dependence, unspecified, uncomplicated: Secondary | ICD-10-CM | POA: Insufficient documentation

## 2012-09-17 DIAGNOSIS — R45851 Suicidal ideations: Secondary | ICD-10-CM | POA: Insufficient documentation

## 2012-09-17 DIAGNOSIS — F32A Depression, unspecified: Secondary | ICD-10-CM

## 2012-09-17 DIAGNOSIS — Z87898 Personal history of other specified conditions: Secondary | ICD-10-CM | POA: Diagnosis present

## 2012-09-17 DIAGNOSIS — T1491XA Suicide attempt, initial encounter: Secondary | ICD-10-CM

## 2012-09-17 DIAGNOSIS — F3289 Other specified depressive episodes: Secondary | ICD-10-CM | POA: Insufficient documentation

## 2012-09-17 LAB — CBC
HCT: 42.9 % (ref 39.0–52.0)
MCHC: 35.4 g/dL (ref 30.0–36.0)
RDW: 11.9 % (ref 11.5–15.5)

## 2012-09-17 LAB — COMPREHENSIVE METABOLIC PANEL
Albumin: 4.2 g/dL (ref 3.5–5.2)
Alkaline Phosphatase: 47 U/L (ref 39–117)
BUN: 13 mg/dL (ref 6–23)
Calcium: 9.4 mg/dL (ref 8.4–10.5)
Potassium: 3.7 mEq/L (ref 3.5–5.1)
Total Protein: 7 g/dL (ref 6.0–8.3)

## 2012-09-17 LAB — ETHANOL: Alcohol, Ethyl (B): <11 mg/dL (ref 0–11)

## 2012-09-17 LAB — ACETAMINOPHEN LEVEL: Acetaminophen (Tylenol), Serum: <15 ug/mL (ref 10–30)

## 2012-09-17 LAB — SALICYLATE LEVEL: Salicylate Lvl: <2 mg/dL — ABNORMAL LOW (ref 2.8–20.0)

## 2012-09-17 LAB — RAPID URINE DRUG SCREEN, HOSP PERFORMED
Benzodiazepines: NOT DETECTED
Cocaine: NOT DETECTED
Opiates: NOT DETECTED

## 2012-09-17 MED ORDER — ACETAMINOPHEN 325 MG PO TABS
650.0000 mg | ORAL_TABLET | Freq: Four times a day (QID) | ORAL | Status: DC | PRN
  Administered 2012-09-19: 650 mg via ORAL

## 2012-09-17 MED ORDER — NICOTINE 21 MG/24HR TD PT24
21.0000 mg | MEDICATED_PATCH | Freq: Every day | TRANSDERMAL | Status: DC
  Administered 2012-09-17: 21 mg via TRANSDERMAL
  Filled 2012-09-17: qty 1

## 2012-09-17 MED ORDER — ZOLPIDEM TARTRATE 5 MG PO TABS: 5.0000 mg | ORAL_TABLET | Freq: Every evening | ORAL | Status: DC | PRN

## 2012-09-17 MED ORDER — LORAZEPAM 1 MG PO TABS: 1.0000 mg | ORAL_TABLET | Freq: Three times a day (TID) | ORAL | Status: DC | PRN

## 2012-09-17 MED ORDER — ALBUTEROL SULFATE HFA 108 (90 BASE) MCG/ACT IN AERS: 2.0000 | INHALATION_SPRAY | RESPIRATORY_TRACT | Status: DC | PRN

## 2012-09-17 MED ORDER — MAGNESIUM HYDROXIDE 400 MG/5ML PO SUSP: 30.0000 mL | Freq: Every day | ORAL | Status: DC | PRN

## 2012-09-17 MED ORDER — ALUM & MAG HYDROXIDE-SIMETH 200-200-20 MG/5ML PO SUSP: 30.0000 mL | ORAL | Status: DC | PRN

## 2012-09-17 MED ORDER — ACETAMINOPHEN 325 MG PO TABS: 650.0000 mg | ORAL_TABLET | ORAL | Status: DC | PRN

## 2012-09-17 MED ORDER — ESCITALOPRAM OXALATE 10 MG PO TABS
10.0000 mg | ORAL_TABLET | Freq: Every day | ORAL | Status: DC
  Administered 2012-09-17 – 2012-09-21 (×5): 10 mg via ORAL
  Filled 2012-09-17: qty 1
  Filled 2012-09-17: qty 14
  Filled 2012-09-17 (×6): qty 1

## 2012-09-17 NOTE — Progress Notes (Signed)
Pt accepted by PA to [facility] 504, bed 1 to Dr. Elsie Saas.  Pt will be transported by security.  CSW will complete admission paperwork.  Karle Plumber  [phone]  09/17/12 16:54 pm

## 2012-09-17 NOTE — ED Notes (Signed)
pts probation officer reports pt is depressed and SI. Probation officer reports mother told her pt tried to hang himself 2 weeks ago, and 1 week ago pt took all of his meds to try and kill himself.  Pt reports he has been laying in bed for last 2 weeks. Yesterday pts mother filed a missing person report, pt reports he was at a friends house.  Recently pt lost his job, afraid he will lose his house, and has 2 small children.  Pt was suppose to check in with probation officer yesterday, did not, but showed up today and probation officer brought him to ED.

## 2012-09-17 NOTE — BH Assessment (Signed)
Assessment Note   [name] is an 22 y.o. male appeared calm and cooperative.  Pt reports feeling sad,  hopeless, and overwhelmed.  He states that he still thinks about hurting himself but has no current plan. Pt has a hx of two previous suicide attempts this week of trying to hang himself and overdose on Celexa.  Pt denies HI/AH/VH.  Pt denies access to weapons.  Pt reports that he drinks ETOH occasionally when he feels stressed.  Pt reports that he smokes marijuana 2x per week.   Pt reports no change in sleep or appetite.  Pt currently lives alone in a house that he rents from his mother.  He reports that he has recently lost his job and is unable to pay his bills and feels like he is going to lose everything.  Pt reports that he has two children and that he has not been able to provide for them adequately and is afraid he is going to go to jail for not supporting them.  He and the mother of his children have frequent conflict.  Pt reports that he had an appt  for an evaluation  and  on 09/14/12 as a result of him calling and stating that he was depressed and feeling like he wanted to hurt himself, but didn't go to the appt.  He reports that he had an inpatient hospitalization in 2006 when he was 13 for depression.  Pt reports that his main source of family support is his mother.   Axis I: Major Depression, Recurrent severe Axis II: Deferred Axis III:  Past Medical History  Diagnosis Date  . Psoriasis     right foot  . Asthma     triggered by cold weather; prn inhaler  . Anxiety     no current med.  . Fracture of metacarpal 02/13/2012    right small  . Depression    Axis IV: economic problems, housing problems, occupational problems, other psychosocial or environmental problems, problems related to legal system/crime, problems related to social environment and problems with primary support group Axis V: 11-20 some danger of hurting self or others possible OR occasionally fails to maintain  minimal personal hygiene OR gross impairment in communication  Past Medical History:  Past Medical History  Diagnosis Date  . Psoriasis     right foot  . Asthma     triggered by cold weather; prn inhaler  . Anxiety     no current med.  . Fracture of metacarpal 02/13/2012    right small  . Depression     Past Surgical History  Procedure Laterality Date  . Tonsillectomy  age 45  . Closed reduction finger with percutaneous pinning  02/16/2012    Procedure: CLOSED REDUCTION FINGER WITH PERCUTANEOUS PINNING;  Surgeon: Marlowe Shores, MD;  Location:  [facility];  Service: Orthopedics;  Laterality: Right;  CLOSED REDUCTION PINNING RT SMALL METACARPAL FX.    Family History: History reviewed. No pertinent family history.  Social History:  reports that he has been smoking Cigarettes.  He has a 6 pack-year smoking history. He has never used smokeless tobacco. He reports that  drinks alcohol. He reports that he does not use illicit drugs.  Additional Social History:  Alcohol / Drug Use History of alcohol / drug use?: Yes Substance #1 Name of Substance 1: ETOH 1 - Age of First Use: 14 1 - Amount (size/oz): six pack 1 - Frequency: occasionally 1 - Duration: years 1 -  Last Use / Amount: last friday Substance #2 Name of Substance 2: Marijuana 2 - Age of First Use: 13 2 - Amount (size/oz): not sure 2 - Frequency: 2x a week 2 - Duration: years 2 - Last Use / Amount: yesterday  CIWA: CIWA-Ar BP: 114/68 mmHg Pulse Rate: 51 COWS:    Allergies: No Known Allergies  Home Medications:  (Not in a hospital admission)  OB/GYN Status:  No LMP for male patient.  General Assessment Data Location of Assessment: [facility] Living Arrangements: Alone Can pt return to current living arrangement?: Yes Admission Status: Voluntary Is patient capable of signing voluntary admission?: Yes Transfer from: Home Referral Source: Self/Family/Friend     Risk to self Suicidal  Ideation: Yes-Currently Present Suicidal Intent: No-Not Currently/Within Last 6 Months Is patient at risk for suicide?: Yes Suicidal Plan?: No-Not Currently/Within Last 6 Months Access to Means: Yes Specify Access to Suicidal Means:  (Pt has access to rope and pills ) What has been your use of drugs/alcohol within the last 12 months?:  (Pt used alcohol last week and marijuana yesterday) Previous Attempts/Gestures: Yes How many times?: 2 (Pt has hx of trying to hang himself and taking pills) Triggers for Past Attempts: Other personal contacts;Other (Comment) Intentional Self Injurious Behavior:  (Pt states just feels overwhelmed and hopeless) Family Suicide History: Unknown Recent stressful life event(s): Conflict (Comment);Job Loss;Financial Problems;Legal Issues Persecutory voices/beliefs?: No Depression: Yes Depression Symptoms: Despondent;Loss of interest in usual pleasures;Feeling worthless/self pity Substance abuse history and/or treatment for substance abuse?: Yes  Risk to Others Homicidal Ideation: No Thoughts of Harm to Others: No Current Homicidal Intent: No Current Homicidal Plan: No Access to Homicidal Means: No History of harm to others?: No Assessment of Violence: None Noted Does patient have access to weapons?: No Criminal Charges Pending?: No Does patient have a court date: No  Psychosis Hallucinations: None noted Delusions: None noted  Mental Status Report Appear/Hygiene: Other (Comment) (calm and casual) Eye Contact: Good Motor Activity: Freedom of movement Speech: Logical/coherent;Soft Level of Consciousness: Alert;Quiet/awake Mood: Depressed;Apathetic;Despair;Helpless;Sad;Worthless, low self-esteem Affect: Angry;Appropriate to circumstance Anxiety Level: None Thought Processes: Coherent;Relevant Judgement: Impaired Orientation: Person;Place;Time;Situation;Appropriate for developmental age Obsessive Compulsive Thoughts/Behaviors: None  Cognitive  Functioning Concentration: Normal Memory: Recent Intact;Remote Intact IQ: Average Insight: Fair Impulse Control: Fair Appetite: Good Weight Loss: 0 Weight Gain: 0 Sleep: No Change Total Hours of Sleep: 7 Vegetative Symptoms: None  ADLScreening [facility] Assessment Services) Patient's cognitive ability adequate to safely complete daily activities?: Yes Patient able to express need for assistance with ADLs?: Yes Independently performs ADLs?: Yes (appropriate for developmental age)  Abuse/Neglect [facility]) Physical Abuse: Denies Verbal Abuse: Denies Sexual Abuse: Denies  Prior Inpatient Therapy Prior Inpatient Therapy: Yes Prior Therapy Dates: 2006 Prior Therapy Facilty/Provider(s): [facility] Reason for Treatment: MDD  Prior Outpatient Therapy Prior Outpatient Therapy: No Prior Therapy Dates:  (pt states he had a 1st appt for 09/14/12 but didn't attend) Reason for Treatment: MDD  ADL Screening (condition at time of admission) Patient's cognitive ability adequate to safely complete daily activities?: Yes Patient able to express need for assistance with ADLs?: Yes Independently performs ADLs?: Yes (appropriate for developmental age)       Abuse/Neglect Assessment (Assessment to be complete while patient is alone) Physical Abuse: Denies Verbal Abuse: Denies Sexual Abuse: Denies Values / Beliefs Cultural Requests During Hospitalization: None Spiritual Requests During Hospitalization: None        Additional Information 1:1 In Past 12 Months?: No CIRT Risk: No Elopement Risk: No Does patient  have medical clearance?: Yes     Disposition:  Disposition Initial Assessment Completed for this Encounter: Yes Disposition of Patient: Inpatient treatment program Type of inpatient treatment program: Adult  On Site Evaluation by:   Reviewed with Physician:     Lexine Baton 09/17/2012 4:29 PM

## 2012-09-17 NOTE — Consult Note (Signed)
Reason for Consult:Major depressive  D/o Referring Physician: Bransen, [name] is an 22 y.o. male.  HPI:  [name] is a 22 years old who was encouraged and brought in by his probation officer to seek help for severe depression and suicidal thoughts.  Patient states he has been depressed for most of his life and have tried several medications.  He reports suicide attempts recently when he over dosed on his Celexa and an attempt to hang himself.  Patient reports a lot of family and financial stressors.  He lost his job recently because he suddenly left the job without any good reasons and never went back.  He states he has two children and a girl friend and he is unable to pay his bills and provide for them.  Patient states he feels worthless and hopeless and that life has no meaning to him any more.  Patient is unable to contract for safety during this interview.  He denies HI/AVH.  He reports occasional alcohol use but does not consider himself alcoholic.  He reports occasional Marijuana use for self treatment of his anxiety and depression and his urine is positive for Marijuana.  Patient states he does not know what he will do to himself if he is discharged.  He reports goo sleep but fair appetite.  We will admit patient to our inpatient unit for safety and stabiliztion.  Past Medical History  Diagnosis Date  . Psoriasis     right foot  . Asthma     triggered by cold weather; prn inhaler  . Anxiety     no current med.  . Fracture of metacarpal 02/13/2012    right small  . Depression     Past Surgical History  Procedure Laterality Date  . Tonsillectomy  age 89  . Closed reduction finger with percutaneous pinning  02/16/2012    Procedure: CLOSED REDUCTION FINGER WITH PERCUTANEOUS PINNING;  Surgeon: Marlowe Shores, MD;  Location: [facility];  Service: Orthopedics;  Laterality: Right;  CLOSED REDUCTION PINNING RT SMALL METACARPAL FX.    History reviewed. No  pertinent family history.  Social History:  reports that he has been smoking Cigarettes.  He has a 6 pack-year smoking history. He has never used smokeless tobacco. He reports that  drinks alcohol. He reports that he does not use illicit drugs.  Allergies: No Known Allergies  Medications: I have reviewed the patient's current medications.  Results for orders placed during the hospital encounter of 09/17/12 (from the past 48 hour(s))  URINE RAPID DRUG SCREEN (HOSP PERFORMED)     Status: Abnormal   Collection Time    09/17/12 10:19 AM      Result Value Range   Opiates NONE DETECTED  NONE DETECTED   Cocaine NONE DETECTED  NONE DETECTED   Benzodiazepines NONE DETECTED  NONE DETECTED   Amphetamines NONE DETECTED  NONE DETECTED   Tetrahydrocannabinol POSITIVE (*) NONE DETECTED   Barbiturates NONE DETECTED  NONE DETECTED   Comment:            DRUG SCREEN FOR MEDICAL PURPOSES     ONLY.  IF CONFIRMATION IS NEEDED     FOR ANY PURPOSE, NOTIFY LAB     WITHIN 5 DAYS.                LOWEST DETECTABLE LIMITS     FOR URINE DRUG SCREEN     Drug Class       Cutoff (ng/mL)  Amphetamine      1000     Barbiturate      200     Benzodiazepine   200     Tricyclics       300     Opiates          300     Cocaine          300     THC              50  ACETAMINOPHEN LEVEL     Status: None   Collection Time    09/17/12 10:25 AM      Result Value Range   Acetaminophen (Tylenol), Serum <15.0  10 - 30 ug/mL   Comment:            THERAPEUTIC CONCENTRATIONS VARY     SIGNIFICANTLY. A RANGE OF 10-30     ug/mL MAY BE AN EFFECTIVE     CONCENTRATION FOR MANY PATIENTS.     HOWEVER, SOME ARE BEST TREATED     AT CONCENTRATIONS OUTSIDE THIS     RANGE.     ACETAMINOPHEN CONCENTRATIONS     >150 ug/mL AT 4 HOURS AFTER     INGESTION AND >50 ug/mL AT 12     HOURS AFTER INGESTION ARE     OFTEN ASSOCIATED WITH TOXIC     REACTIONS.  CBC     Status: None   Collection Time    09/17/12 10:25 AM      Result  Value Range   WBC 6.9  4.0 - 10.5 K/uL   RBC 4.94  4.22 - 5.81 MIL/uL   Hemoglobin 15.2  13.0 - 17.0 g/dL   HCT 47.8  29.5 - 62.1 %   MCV 86.8  78.0 - 100.0 fL   MCH 30.8  26.0 - 34.0 pg   MCHC 35.4  30.0 - 36.0 g/dL   RDW 30.8  65.7 - 84.6 %   Platelets 265  150 - 400 K/uL  COMPREHENSIVE METABOLIC PANEL     Status: Abnormal   Collection Time    09/17/12 10:25 AM      Result Value Range   Sodium 141  135 - 145 mEq/L   Potassium 3.7  3.5 - 5.1 mEq/L   Chloride 104  96 - 112 mEq/L   CO2 28  19 - 32 mEq/L   Glucose, Bld 90  70 - 99 mg/dL   BUN 13  6 - 23 mg/dL   Creatinine, Ser 9.62  0.50 - 1.35 mg/dL   Calcium 9.4  8.4 - 95.2 mg/dL   Total Protein 7.0  6.0 - 8.3 g/dL   Albumin 4.2  3.5 - 5.2 g/dL   AST 13  0 - 37 U/L   ALT 9  0 - 53 U/L   Alkaline Phosphatase 47  39 - 117 U/L   Total Bilirubin 0.2 (*) 0.3 - 1.2 mg/dL   GFR calc non Af Amer >90  >90 mL/min   GFR calc Af Amer >90  >90 mL/min   Comment:            The eGFR has been calculated     using the CKD EPI equation.     This calculation has not been     validated in all clinical     situations.     eGFR's persistently     <90 mL/min signify     possible Chronic Kidney Disease.  ETHANOL  Status: None   Collection Time    09/17/12 10:25 AM      Result Value Range   Alcohol, Ethyl (B) <11  0 - 11 mg/dL   Comment:            LOWEST DETECTABLE LIMIT FOR     SERUM ALCOHOL IS 11 mg/dL     FOR MEDICAL PURPOSES ONLY  SALICYLATE LEVEL     Status: Abnormal   Collection Time    09/17/12 10:25 AM      Result Value Range   Salicylate Lvl <2.0 (*) 2.8 - 20.0 mg/dL    No results found.  Review of Systems  Constitutional: Negative.   HENT: Negative.   Eyes: Negative.   Respiratory: Negative.   Cardiovascular: Negative.   Gastrointestinal: Negative.   Genitourinary: Negative.   Musculoskeletal: Negative.   Skin: Negative.   Neurological: Negative.   Endo/Heme/Allergies: Negative.   Psychiatric/Behavioral:  Positive for depression (Hx of recurrent major depression, rates his depression at 8/10), suicidal ideas (Hx of two previous attempts with OD on Celexa and attempt to hang self) and substance abuse (Occassional Marijuana use). Negative for hallucinations and memory loss. The patient is nervous/anxious (Rates anxiety 8/10). The patient does not have insomnia.    Blood pressure 114/68, pulse 51, temperature 97.5 F (36.4 C), temperature source Oral, resp. rate 17, SpO2 100.00%. Physical Exam  Constitutional: He is oriented to person, place, and time. He appears well-developed and well-nourished. No distress.  HENT:  Head: Normocephalic and atraumatic.  Eyes: Conjunctivae and EOM are normal. Right eye exhibits no discharge. Left eye exhibits no discharge. No scleral icterus.  Neck: Normal range of motion. Neck supple. No JVD present. No tracheal deviation present. No thyromegaly present.  Cardiovascular: Normal rate and regular rhythm.   Respiratory: Effort normal and breath sounds normal. No respiratory distress. He has no wheezes. He has no rales. He exhibits no tenderness.  GI: Soft. Bowel sounds are normal. He exhibits no distension and no mass. There is no tenderness. There is no rebound and no guarding.  Musculoskeletal: Normal range of motion. He exhibits no edema and no tenderness.  Lymphadenopathy:    He has no cervical adenopathy.  Neurological: He is alert and oriented to person, place, and time. He has normal reflexes. He displays normal reflexes. No cranial nerve deficit. He exhibits normal muscle tone. Coordination normal.  Skin: Skin is warm and dry. No rash noted. He is not diaphoretic. No erythema. No pallor.    Diagnosis: Recurrent severe major depressive D/O, Anxiety D/O  Assessment/Plan:  Based on hx of two previous suicide attempt recently, patient will be admitted to our inpatient unit for safety and stabilization We will start him on Lexapro antidepressant  For his  depression and anxiety. He will be encouraged to participate in group sessions Goal is to assist him learn new coping mechanisms in other to manage daily stress.    Dahlia Byes, C  PMHNP-BC 09/17/2012, 3:22 PM     I have personally seen the patient and agreed with the findings and involved in the treatment plan. Kathryne Sharper, MD

## 2012-09-17 NOTE — Tx Team (Signed)
Initial Interdisciplinary Treatment Plan  PATIENT STRENGTHS: (choose at least two) Active sense of humor Average or above average intelligence Capable of independent living  PATIENT STRESSORS: Financial difficulties Loss of job Marital or family conflict Occupational concerns Substance abuse   PROBLEM LIST: Problem List/Patient Goals Date to be addressed Date deferred Reason deferred Estimated date of resolution  depression 09-17-12   D/c        Suicidal ideation 09-17-12   D/c                                       DISCHARGE CRITERIA:  Ability to meet basic life and health needs Improved stabilization in mood, thinking, and/or behavior Motivation to continue treatment in a less acute level of care Reduction of life-threatening or endangering symptoms to within safe limits Verbal commitment to aftercare and medication compliance  PRELIMINARY DISCHARGE PLAN: Outpatient therapy Return to previous living arrangement Return to previous work or school arrangements  PATIENT/FAMIILY INVOLVEMENT: This treatment plan has been presented to and reviewed with the patient, [name], and/or family member, .  The patient and family have been given the opportunity to ask questions and make suggestions.  Malva Limes 09/17/2012, 7:00 PM

## 2012-09-17 NOTE — ED Notes (Signed)
Report given to [facility], awaiting transport w/security.

## 2012-09-17 NOTE — ED Provider Notes (Signed)
History    CSN: [id number] Arrival date & time 09/17/12  1009  First MD Initiated Contact with Patient 09/17/12 1015     Chief Complaint  Patient presents with  . Medical Clearance   (Consider location/radiation/quality/duration/timing/severity/associated sxs/prior Treatment) HPI  [name] is a(n) 22 y.o. male who presents to emergency department with chief complaint of depression and suicide attempt.  She is here today with his probation officer who suggested he come in.  He states that he has been more and more depressed over the past month.  He states he many social pressures.  The patient states that over the past week he tried to hang himself and he also tried to overdose on his Celexa.  He states that he thinks about dying and death all the time.  He denies access to weapons or family history of completed suicide.  He does use marijuana on a recreational basis but denies illicit drug use or alcohol abuse.  He has a past history of psychiatric admission for depression.  Denies any medical complaints at this time.   Past Medical History  Diagnosis Date  . Psoriasis     right foot  . Asthma     triggered by cold weather; prn inhaler  . Anxiety     no current med.  . Fracture of metacarpal 02/13/2012    right small  . Depression    Past Surgical History  Procedure Laterality Date  . Tonsillectomy  age 69  . Closed reduction finger with percutaneous pinning  02/16/2012    Procedure: CLOSED REDUCTION FINGER WITH PERCUTANEOUS PINNING;  Surgeon: Marlowe Shores, MD;  Location: [facility];  Service: Orthopedics;  Laterality: Right;  CLOSED REDUCTION PINNING RT SMALL METACARPAL FX.   History reviewed. No pertinent family history. History  Substance Use Topics  . Smoking status: Current Every Day Smoker -- 1.00 packs/day for 6 years    Types: Cigarettes  . Smokeless tobacco: Never Used  . Alcohol Use: Yes     Comment: occasionally    Review of Systems   Constitutional: Negative for fever and chills.  Respiratory: Negative for cough and shortness of breath.   Cardiovascular: Negative for chest pain and palpitations.  Gastrointestinal: Negative for vomiting, abdominal pain, diarrhea and constipation.  Genitourinary: Negative for dysuria, urgency and frequency.  Musculoskeletal: Negative for myalgias and arthralgias.  Skin: Negative for rash.  Neurological: Negative for headaches.  Psychiatric/Behavioral: Positive for suicidal ideas.    Allergies  Review of patient's allergies indicates no known allergies.  Home Medications   Current Outpatient Rx  Name  Route  Sig  Dispense  Refill  . albuterol (PROVENTIL HFA;VENTOLIN HFA) 108 (90 BASE) MCG/ACT inhaler   Inhalation   Inhale 2 puffs into the lungs every 4 (four) hours as needed for wheezing or shortness of breath.           BP 121/75  Pulse 55  Temp(Src) 97.6 F (36.4 C) (Oral)  Resp 16  SpO2 98% Physical Exam  Nursing note and vitals reviewed. Constitutional: He appears well-developed and well-nourished. No distress.  HENT:  Head: Normocephalic and atraumatic.  Eyes: Conjunctivae are normal. No scleral icterus.  Neck: Normal range of motion. Neck supple.  Cardiovascular: Normal rate, regular rhythm and normal heart sounds.   Pulmonary/Chest: Effort normal and breath sounds normal. No respiratory distress.  Abdominal: Soft. There is no tenderness.  Musculoskeletal: He exhibits no edema.  Neurological: He is alert.  Skin: Skin is  warm and dry. He is not diaphoretic.  Psychiatric: His behavior is normal. Thought content normal.  Depressed mood    ED Course  Procedures (including critical care time) Labs Reviewed  COMPREHENSIVE METABOLIC PANEL - Abnormal; Notable for the following:    Total Bilirubin 0.2 (*)    All other components within normal limits  SALICYLATE LEVEL - Abnormal; Notable for the following:    Salicylate Lvl <2.0 (*)    All other components  within normal limits  URINE RAPID DRUG SCREEN (HOSP PERFORMED) - Abnormal; Notable for the following:    Tetrahydrocannabinol POSITIVE (*)    All other components within normal limits  ACETAMINOPHEN LEVEL  CBC  ETHANOL   No results found. 1. Suicide attempt   2. Depression     MDM  11:43 AM Filed Vitals:   09/17/12 1021  BP: 121/75  Pulse: 55  Temp: 97.6 F (36.4 C)  Resp: 16   Patient medically cleared for psych eval.  I consult with the act team and they believe patient will need admission for suicidal behavior.    Arthor Captain, PA-C 09/17/12 1526

## 2012-09-17 NOTE — Progress Notes (Signed)
Nursing Admission Note. Patient presents to [facility] Adult unit with history of depression, and suicidal ideation. Patient  Reports long history of depression, with increasing depression and anxiety x 3 weeks to the point he states ''i couldn't even handle anything or do my job. i just shut down. I didn't do anything for two weeks'' patient reports he attempted to hang self two weeks ago. Currently patient presents with flat affect and depressed mood. Patient denies any auditory or visual hallucinations, and denies any specific suicidal ideation and is able to verbally contract for safety. Denies HI. Patient skin assessed and search performed, no contraband found. Patient oriented to unit and admission policies. No reports of pain. Will continue to monitor q 15 minutes for safety.

## 2012-09-17 NOTE — ED Notes (Signed)
Depressed, has lost his job, has 2 small children, needs place to live b/c he thinks he is going to lose his home he is currently in, has no income to [employer] w/food and bills, encouraged him to talk to SW or go to [facility] to see if he and family qualifies for help/assistance.

## 2012-09-17 NOTE — Progress Notes (Signed)
CSW called [employer] for pre-cert  authorization for pt.  Pre-cert # [id number] for 4 days 09/17/12-09/20/12 per Judeen Hammans.  Concurrent review 09/20/12.  Karle Plumber  [phone] 09/17/12  19:07 p.m.

## 2012-09-17 NOTE — ED Notes (Signed)
Pt 1 belonging bag given to pysch ED.

## 2012-09-18 ENCOUNTER — Encounter (HOSPITAL_COMMUNITY): Payer: Self-pay | Admitting: Psychiatry

## 2012-09-18 DIAGNOSIS — Z8659 Personal history of other mental and behavioral disorders: Secondary | ICD-10-CM | POA: Diagnosis present

## 2012-09-18 DIAGNOSIS — Z87898 Personal history of other specified conditions: Secondary | ICD-10-CM | POA: Diagnosis present

## 2012-09-18 MED ORDER — NICOTINE 21 MG/24HR TD PT24
21.0000 mg | MEDICATED_PATCH | Freq: Every day | TRANSDERMAL | Status: DC
  Administered 2012-09-18 – 2012-09-20 (×3): 21 mg via TRANSDERMAL
  Filled 2012-09-18 (×6): qty 1

## 2012-09-18 MED ORDER — HYDROXYZINE HCL 25 MG PO TABS
25.0000 mg | ORAL_TABLET | Freq: Four times a day (QID) | ORAL | Status: DC | PRN
  Administered 2012-09-18 – 2012-09-20 (×4): 25 mg via ORAL
  Filled 2012-09-18: qty 30

## 2012-09-18 MED ORDER — TRAZODONE HCL 50 MG PO TABS
50.0000 mg | ORAL_TABLET | Freq: Every evening | ORAL | Status: DC | PRN
  Administered 2012-09-18 – 2012-09-20 (×3): 50 mg via ORAL
  Filled 2012-09-18: qty 28
  Filled 2012-09-18 (×3): qty 1

## 2012-09-18 NOTE — Progress Notes (Signed)
[name] is seen OOB UAL on the 500 hall today.HE is anxious. HE is seen wringng his hands, intertwined together and he tells this nurse " I feel so anxious". He is quiet, isolative. He does attend his groups and is engaged in the discussion, although he does not speak openly himslef.    A He completes his AM self invneotry and on it he writes he cont to have " off and on" SI, but he rates his depression and hopelessness "3/3" and he states his DC paln is to " stay on his meds".   R Safety is in place and POC cont. PA is paged at [phone], requesting prn order for anxiety.

## 2012-09-18 NOTE — Progress Notes (Signed)
.  Psychoeducational Group Note    Date: 09/18/2012 Time:  0930   Goal Setting Purpose of Group: To be able to set a goal that is measurable and that can be accomplished in one day Participation Level:  Active  Participation Quality:  Appropriate  Affect:  Appropriate  Cognitive:  Alert  Insight:  Improving  Engagement in Group:  engaged  Additional Comments:  Pt states that he wants to reduce his stress. He will do this by identifying stressors today.  Dione Housekeeper

## 2012-09-18 NOTE — [facility] Suicide Risk Assessment (Signed)
Suicide Risk Assessment  Admission Assessment     Nursing information obtained from:  Patient Demographic factors:  Male;Adolescent or young adult;Low socioeconomic status;Unemployed Current Mental Status:  Self-harm thoughts;Self-harm behaviors Loss Factors:  Decrease in vocational status;Legal issues;Financial problems / change in socioeconomic status Historical Factors:  Prior suicide attempts Risk Reduction Factors:  Responsible for children under 32 years of age  CLINICAL FACTORS:   Depression:   Comorbid alcohol abuse/dependence Alcohol/Substance Abuse/Dependencies  COGNITIVE FEATURES THAT CONTRIBUTE TO RISK:  Closed-mindedness Polarized thinking Thought constriction (tunnel vision)    SUICIDE RISK:   Moderate:  Frequent suicidal ideation with limited intensity, and duration, some specificity in terms of plans, no associated intent, good self-control, limited dysphoria/symptomatology, some risk factors present, and identifiable protective factors, including available and accessible social support.  PLAN OF CARE: Supportive approach/coping skills/relapse prevention                               Reassess co morbidities                               Stress management/CBT/Mindfulness  I certify that inpatient services furnished can reasonably be expected to improve the patient's condition.  [name] A 09/18/2012, 12:49 PM

## 2012-09-18 NOTE — [facility] Group Notes (Signed)
[facility] Group Notes:  (Clinical Social Work)  09/18/2012   3:00-4:00PM  Summary of Progress/Problems:   The main focus of today's process group was for the patient to identify something in their life that led to their hospitalization that they would like to change, then to discuss their motivation to change.  The Stages of Change were explained to the group, then each patient identified where they are in that process.  A scaling question was used with motivation interviewing to determine the patient's current motivation to change the identified behavior (1-10, low to high). The patient expressed he tends to avoid his problems, and that he is in the Preparation Stage and plans to move out of the home with his babies' mother in order to provide a more positive environment for himself and his daughters.  Type of Therapy:  Process Group  Participation Level:  Active  Participation Quality:  Attentive  Affect:  Blunted  Cognitive:  Oriented  Insight:  Developing/Improving  Engagement in Therapy:  Engaged  Modes of Intervention:  Education, Motivational Interviewing   Ambrose Mantle, LCSW 09/18/2012, 4:33 PM

## 2012-09-18 NOTE — Progress Notes (Signed)
Psychoeducational Group Note  Date: 09/18/2012 Time:  1015  Group Topic/Focus:  Identifying Needs:   The focus of this group is to help patients identify their personal needs that have been historically problematic and identify healthy behaviors to address their needs.  Participation Level:  Active  Participation Quality:  Appropriate  Affect:  Appropriate  Cognitive:  Alert  Insight: improving  Engagement in Group:  Engaged  Additional Comments: Good participation   Nikoletta Varma A 
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
## 2012-09-18 NOTE — Progress Notes (Signed)
The focus of this group is to help patients review their daily goal of treatment and discuss progress on daily workbooks. Pt attended the evening group session and responded to all discussion prompts from the Writer. Pt reported having a good day primarily because his anxiety level was lower than it has been in a while. Pt did mention being ready to go home and requested to sign a 72-hr discharge following group. Pt's affect was bright.

## 2012-09-18 NOTE — Progress Notes (Signed)
[facility] Group Notes:  (Nursing/MHT/Case Management/Adjunct)  Date:  09/18/2012  Time:  4:12 PM  Type of Therapy:  Psychoeducational Skills  Participation Level:  Did Not Attend  Participation Quality:  Did not attend  Affect:  Did not attend  Cognitive:  Did not attend  Insight:  None  Engagement in Group:  Did not attend  Modes of Intervention:  Did not attend  Summary of Progress/Problems: The purpose of this group was to discuss positive and negative coping skills and why it is important to use healthy coping skills versus unhealthy coping skills. Pt did not attend group.   Caswell Corwin 09/18/2012, 4:12 PM

## 2012-09-18 NOTE — Progress Notes (Signed)
Psychoeducational Group Note  Date:  09/17/2012 Time:  2000  Group Topic/Focus:  Wrap-Up Group:   The focus of this group is to help patients review their daily goal of treatment and discuss progress on daily workbooks.  Participation Level: Did Not Attend  Participation Quality:  Not Applicable  Affect:  Not Applicable  Cognitive:  Not Applicable  Insight:  Not Applicable  Engagement in Group: Not Applicable  Additional Comments:  Pt stayed in bed during wrap-up group.  Kaleen Odea R 09/18/2012, 5:51 AM

## 2012-09-18 NOTE — Progress Notes (Signed)
Pt requested and was provided with 72 hour RFD form which is on paper chart. [name]
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
## 2012-09-18 NOTE — Progress Notes (Signed)
Patient ID: [name], male   DOB: 1990-04-22, 22 y.o.   MRN: [id number] D: Patient denies SI/HI/AVH and pain. Pt seems to have a depressed mood and flat affect. Pt is isolative in room with little interaction with peers and staff. Pt did not  attend evening wrap up. Pt denies any needs or concerns. No acute distressed noted at this time.   A: Met with pt 1:1. Pt encouraged to come to staff with any question or concerns. 15 minutes checks for safety.  R: Patient remains safe. Continue current POC.

## 2012-09-18 NOTE — Progress Notes (Signed)
Spoke with pt who is noticeably anxious. Denies it was related to stressful events on unit that occurred at dinner but rather his own personal stressors. Asking for prn prior to shift change and order was obtained however pt was able to calm himself with a shower. He now would like to take the vistaril prn closer to bed time. Supported, processed further with patient who is receptive.  When asked about SI pt responded, "not today fortunately." Denies HI/AVH and remains safe. Lawrence Marseilles

## 2012-09-19 NOTE — Progress Notes (Signed)
Spoke with pt 1:1 who remains flat, anxious, depressed. States he feels improvement however. Received vistaril slightly before change of shift and reports decrease in anxiety. Now complains of a headache for which he was given tylenol prn. Supported, reassured. States his real stress is outside of hospital and that he is anxious to discharge so that he can find a job. "I spent a month just taking care of my kids. I should have been looking for work." Encouraged pt to gain as many coping skills as possible while here to which pt was receptive. Denies SI/HI/AVH and remains safe. Lawrence Marseilles

## 2012-09-19 NOTE — Progress Notes (Signed)
Psychoeducational Group Note  Date:  09/19/2012 Time: 1015 Group Topic/Focus:  Making Healthy Choices:   The focus of this group is to help patients identify negative/unhealthy choices they were using prior to admission and identify positive/healthier coping strategies to replace them upon discharge.  Participation Level:  Active     Participation Quality:  Appropriate  Affect:  Appropriate  Cognitive:  Confused  Insight:  Engaged  Engagement in Group:  Engaged  Additional Comments:    Rich Brave 4:00 PM. 09/19/2012

## 2012-09-19 NOTE — [facility] Group Notes (Signed)
[facility] Group Notes:  (Clinical Social Work)  09/19/2012   3:00-4:00PM  Summary of Progress/Problems:   The main focus of today's process group was to   identify the patient's current support system and decide on other supports that can be put in place.  The picture on workbook was used to discuss why additional supports are needed, and a hand-out was distributed with four definitions/levels of support, then used to talk about how patients have given and received all different kinds of support.  An emphasis was placed on using counselor, doctor, therapy groups, 12-step groups, and problem-specific support groups to expand supports.  The patient identified one additional support as being therapy, which he stated he was resistant to initially; however, he now acknowledges that he needs this kind of extra support.  Type of Therapy:  Process Group  Participation Level:  Active  Participation Quality:  Attentive  Affect:  Blunted  Cognitive:  Oriented  Insight:  Developing/Improving  Engagement in Therapy:  Developing/Improving  Modes of Intervention:  Education,  Support and Processing  Ambrose Mantle, LCSW 09/19/2012, 4:29 PM

## 2012-09-19 NOTE — Progress Notes (Signed)
Psychoeducational Group Note  Date:  09/19/2012 Time: 0930 Group Topic/Focus:  Identifying Positives:   The focus of this group is to help patients identify  Positive things that have impacted their lives and to help them learn to focus on the positive. Participation Level:  active Participation Quality: good Affect: flat Cognitive:    Insight:  good  Engagement in Group: engaged  Additional Comments:  PD RN [facility]

## 2012-09-19 NOTE — Progress Notes (Signed)
D Josh is seen OOB UAL on the 500 hall today...tolerated fair. HE remains quiet, sad and isolative. HE does not initiate spontaneous conversation with staff. He is compliant with his meds and does sit quietly in the dayroom, in between groups, watching TV.  A He completed his AM self inventory and on it he wrote he deneid SI, he rated his depression and hopelessness "2/2" and he stated his DC plan is to " attend Out patient care and stay on meds".  R Safety is in place and POC moves forward.

## 2012-09-19 NOTE — Progress Notes (Signed)
[facility] MD Progress Note  09/19/2012 1:53 PM [name]  MRN:  [id number] Subjective:  [name] reports that he has not had any suicidal thoughts since he has been admitted. He reports he is doing fairly well, and feels that his depression is decreasing. He rates his depression as a 3 on a scale of 1-10, where 10 is the worst. He rates his anxiety as a 4 on a scale of 1-10. He endorses good sleep last night, but feels that the trazodone made him groggy this morning. He took some Vistaril yesterday afternoon because he felt his anxiety escalating. He feels the Vistaril helped him to calm down. He reports his appetite is good. He is tolerating the Lexapro without difficulty.  Diagnosis:   Axis I: Depressive disorder NOS, Anxiety Disorder NOS, Marijuana Abuse Axis II: Deferred Axis III:  Past Medical History  Diagnosis Date  . Psoriasis     right foot  . Asthma     triggered by cold weather; prn inhaler  . Anxiety     no current med.  . Fracture of metacarpal 02/13/2012    right small  . Depression     ADL's:  Intact  Sleep: Good  Appetite:  Good  Suicidal Ideation:  Patient denies any thought, plan, or intent Homicidal Ideation:  Patient denies any thought, plan, or intent AEB (as evidenced by):  Psychiatric Specialty Exam: Review of Systems  Constitutional: Negative.   HENT: Negative.   Eyes: Negative.   Respiratory: Negative.   Cardiovascular: Negative.   Genitourinary: Negative.   Musculoskeletal: Negative.   Skin: Negative.   Neurological: Negative.   Endo/Heme/Allergies: Negative.   Psychiatric/Behavioral: Positive for depression. Negative for suicidal ideas and hallucinations. The patient is nervous/anxious. The patient does not have insomnia.     Blood pressure 94/64, pulse 66, temperature 98.1 F (36.7 C), temperature source Oral, resp. rate 16, height 5' 6.14" (1.68 m), weight 54.885 kg (121 lb), SpO2 99.00%.Body mass index is 19.45 kg/(m^2).  General  Appearance: Casual  Eye Contact::  Good  Speech:  Clear and Coherent  Volume:  Decreased  Mood:  Dysphoric  Affect:  Congruent  Thought Process:  Linear and Logical  Orientation:  Full (Time, Place, and Person)  Thought Content:  WDL  Suicidal Thoughts:  No  Homicidal Thoughts:  No  Memory:  Immediate;   Good Recent;   Good Remote;   Good  Judgement:  Fair  Insight:  Fair  Psychomotor Activity:  Normal  Concentration:  Good  Recall:  Good  Akathisia:  No  Handed:  Right  AIMS (if indicated):     Assets:  Communication Skills Desire for Improvement Physical Health  Sleep:  Number of Hours: 6.25   Current Medications: Current Facility-Administered Medications  Medication Dose Route Frequency Provider Last Rate Last Dose  . acetaminophen (TYLENOL) tablet 650 mg  650 mg Oral Q6H PRN Earney Navy, NP      . albuterol (PROVENTIL HFA;VENTOLIN HFA) 108 (90 BASE) MCG/ACT inhaler 2 puff  2 puff Inhalation Q4H PRN Court Joy, PA-C      . alum & mag hydroxide-simeth (MAALOX/MYLANTA) 200-200-20 MG/5ML suspension 30 mL  30 mL Oral Q4H PRN Earney Navy, NP      . escitalopram (LEXAPRO) tablet 10 mg  10 mg Oral Daily Earney Navy, NP   10 mg at 09/19/12 1139  . hydrOXYzine (ATARAX/VISTARIL) tablet 25 mg  25 mg Oral Q6H PRN Jorje Guild, PA-C   25  mg at 09/18/12 2126  . magnesium hydroxide (MILK OF MAGNESIA) suspension 30 mL  30 mL Oral Daily PRN Earney Navy, NP      . nicotine (NICODERM CQ - dosed in mg/24 hours) patch 21 mg  21 mg Transdermal Daily Nehemiah Settle, MD   21 mg at 09/19/12 1610  . traZODone (DESYREL) tablet 50 mg  50 mg Oral QHS PRN Jorje Guild, PA-C   50 mg at 09/18/12 2252    Lab Results: No results found for this or any previous visit (from the past 48 hour(s)).  Physical Findings: AIMS:  , ,  ,  ,    CIWA:    COWS:     Treatment Plan Summary: Daily contact with patient to assess and evaluate symptoms and progress in  treatment Medication management  Plan: We will continue his current medication regimen. He can refuse the trazodone tonight if he does not want to take it. We will continue to monitor for changes in mood or behavior.  Medical Decision Making Problem Points:  Established problem, stable/improving (1), Review of last therapy session (1) and Review of psycho-social stressors (1) Data Points:  Review or order clinical lab tests (1) Review of medication regiment & side effects (2)  I certify that inpatient services furnished can reasonably be expected to improve the patient's condition.   Marissia Blackham 09/19/2012, 1:53 PM

## 2012-09-19 NOTE — Progress Notes (Signed)
[facility] Group Notes:  (Nursing/MHT/Case Management/Adjunct)  Date:  09/19/2012  Time:  6:56 PM  Type of Therapy:  Psychoeducational Skills  Participation Level:  Active  Participation Quality:  Appropriate and Supportive  Affect:  Appropriate  Cognitive:  Appropriate  Insight:  Good  Engagement in Group:  Engaged and Supportive  Modes of Intervention:  Clarification, Discussion and Socialization  Summary of Progress/Problems: The purpose of this group was to discuss healthy supports that patients utilize when discharged and support they would like to build. Pt stated his main support is his Aunt. Pt states she is very clear headed and logical.

## 2012-09-20 DIAGNOSIS — F1210 Cannabis abuse, uncomplicated: Secondary | ICD-10-CM

## 2012-09-20 DIAGNOSIS — F329 Major depressive disorder, single episode, unspecified: Secondary | ICD-10-CM

## 2012-09-20 DIAGNOSIS — F411 Generalized anxiety disorder: Secondary | ICD-10-CM

## 2012-09-20 NOTE — Progress Notes (Signed)
Grief and Loss Group  Group members discussed their experiences with grief and loss and the feelings they had surrounding those experiences. To facilitate this discussion, group members choose one of a series of displayed photographs that represented their experiences of grief and loss and then each member shared their photo and why they choose it during the group.  Pt talked about feeling an enormous amount of pressure throughout his life from his parents to achieve in school and pursue a career in a specific area even as he felt that he hated school and did not like the area that his parents had chosen for him. Pt told the story of how he flunked out of college during his 1st year and felt like he had let down his parents and the college since they had given him a full scholarship. He expressed a desire to find his own identity and discover what he wanted to pursue in his life and how he could find the strength and confidence to be able to pursue his own passions while feeling the pressure of needing to make money. The pt stated that he had a lot of soul searching to do to help him discover what he really wanted out of life.   Lenoard Aden Counseling Intern Haroldine Laws

## 2012-09-20 NOTE — [facility] Suicide Risk Assessment (Signed)
[facility] INPATIENT:  Family/Significant Other Suicide Prevention Education  Suicide Prevention Education:  Education Completed; [place], [place], [phone]; has been identified by the patient as the family member/significant other with whom the patient will be residing, and identified as the person(s) who will aid the patient in the event of a mental health crisis (suicidal ideations/suicide attempt).  With written consent from the patient, the family member/significant other has been provided the following suicide prevention education, prior to the and/or following the discharge of the patient.  The suicide prevention education provided includes the following:  Suicide risk factors  Suicide prevention and interventions  National Suicide Hotline telephone number  [facility] assessment telephone number  [facility] Emergency Assistance 911  [facility] and/or Residential Mobile Crisis Unit telephone number  Request made of family/significant other to:  Remove weapons (e.g., guns, rifles, knives), all items previously/currently identified as safety concern.   Family member advised patient does not have access to guns.      Remove drugs/medications (over-the-counter, prescriptions, illicit drugs), all items previously/currently identified as a safety concern.  The family member/significant other verbalizes understanding of the suicide prevention education information provided.  The family member/significant other agrees to remove the items of safety concern listed above.  Wynn Banker 09/20/2012, 12:23 PM

## 2012-09-20 NOTE — [facility] Group Notes (Signed)
[facility] LCSW Group Therapy          Overcoming Obstacles       1:15 -2:30        09/20/2012   3:07 PM     Type of Therapy:  Group Therapy  Participation Level:  Minimal  Participation Quality:  Appropriate  Affect:  Appropriate, Alert  Cognitive:  Attentive Appropriate  Insight: Developing/Improving  Engagement in Therapy: Developing/Imprvoing  Modes of Intervention:  Discussion Exploration  Education Rapport BuildingProblem-Solving Support  Summary of Progress/Problems:  Patient listened attentively but did not engage in discussion.  Wynn Banker 09/20/2012    3:07 PM

## 2012-09-20 NOTE — Tx Team (Signed)
Interdisciplinary Treatment Plan Update   Date Reviewed:  09/20/2012  Time Reviewed:  10:19 AM  Progress in Treatment:   Attending groups: Yes Participating in groups: Yes Taking medication as prescribed: Yes  Tolerating medication: Yes Family/Significant other contact made: Yes  Patient understands diagnosis: Yes  Discussing patient identified problems/goals with staff: Yes Medical problems stabilized or resolved: Yes Denies suicidal/homicidal ideation: Yes Patient has not harmed self or others: Yes  For review of initial/current patient goals, please see plan of care.  Estimated Length of Stay:    Reasons for Continued Hospitalization:  Anxiety Depression Medication stabilization Suicidal ideation  New Problems/Goals identified:    Discharge Plan or Barriers:   Home with outpatient follow up  Additional Comments:  Attendees:  Patient:  09/20/2012 10:19 AM   Signature: Mervyn Gay, MD 09/20/2012 10:19 AM  Signature: 09/20/2012 10:19 AM  Signature: Harold Barban, RN 09/20/2012 10:19 AM  Signature:Beverly Terrilee Croak, RN 09/20/2012 10:19 AM  Signature:  Neill Loft RN 09/20/2012 10:19 AM  Signature:  Juline Patch, LCSW 09/20/2012 10:19 AM  Signature:  Reyes Ivan, LCSW 09/20/2012 10:19 AM  Signature:  Maseta Dorley,Care Coordinator 09/20/2012 10:19 AM  Signature: Fransisca Kaufmann, [facility] 09/20/2012 10:19 AM  Signature:    Signature:    Signature:      Scribe for Treatment Team:   Juline Patch,  09/20/2012 10:19 AM Interdisciplinary Treatment Plan Update   Date Reviewed:  09/20/2012  Time Reviewed:  10:19 AM  Progress in Treatment:   Attending groups: Yes Participating in groups: Yes Taking medication as prescribed: Yes  Tolerating medication: Yes Family/Significant other contact made:No, but will ask patient for consent for collateral contact Patient understands diagnosis: Yes  Discussing patient identified problems/goals with staff: Yes Medical problems stabilized or  resolved: Yes Denies suicidal/homicidal ideation: Yes Patient has not harmed self or others: Yes  For review of initial/current patient goals, please see plan of care.  Estimated Length of Stay:  1 day  Reasons for Continued Hospitalization:  Anxiety Depression Medication stabilization  New Problems/Goals identified:    Discharge Plan or Barriers:   Home with outpatient follow up  Additional Comments: N/A  Attendees:  Patient:  09/20/2012 10:19 AM   Signature: Verne Spurr, PA  09/20/2012 10:19 AM  Signature: 09/20/2012 10:19 AM  Signature: Elliot Cousin, RN 09/20/2012 10:19 AM  Signature:Beverly Terrilee Croak, RN 09/20/2012 10:19 AM  Signature:  Neill Loft RN 09/20/2012 10:19 AM  Signature:  Juline Patch, LCSW 09/20/2012 10:19 AM  Signature:  Reyes Ivan, LCSW 09/20/2012 10:19 AM  Signature:  Sharin Grave Coordinator 09/20/2012 10:19 AM  Signature:  Onnie Boer, RN UR 09/20/2012 10:19 AM  Signature:    Signature:    Signature:      Scribe for Treatment Team:   Juline Patch,  09/20/2012 10:19 AM

## 2012-09-20 NOTE — [facility] Group Notes (Signed)
[facility] LCSW Aftercare Discharge Planning Group Note   09/20/2012 11:07 AM  Participation Quality: Active  Mood/Affect:  Appropriate  Depression Rating:  1  Anxiety Rating:  2  Thoughts of Suicide:  No  Will you contract for safety?   NA  Current AVH:  No  Plan for Discharge/Comments:  Patient attending discharge planning group and actively participated in group. He reports being much better and hopes to discharge home soon.  He advised he was scheduled with [facility] on Anthony 15, 2014 but did not make the appointment and is asking to be referred back for service and to be scheduled with a therapist.  Patient will return to his home at discharge. CSW provided all participants with daily workbook.   Transportation Means: Patient has transportation.   Supports:  Patient has limited support system.   [name], [name]

## 2012-09-20 NOTE — Progress Notes (Signed)
Recreation Therapy Notes  Date: 07.21.2014   Time: 3:00pm Location: [address] Dayroom     Group Topic/Focus: Self Esteem  Participation Level: Active  Participation Quality: Appropriate   Affect: Euthymic  Cognitive: Appropriate  Group discussion: Affect of self-esteem on whole wellness.   Patient participation: Active  Additional Comments: Activity: Teacher, adult education; Explanation: Patients were asked to select as many toilet paper squares as they will need until after dinner this evening. For each square selected patient was asked to state a positive quality about themselves.   Patient actively participated in activity and contributed to group discussion. Patient successfully stated positive qualities about himself. Patient positive self statements focused on patient relationships with family, such as being a good daddy. Patient stated having good self-esteem can encourage you to be a healthier person.   [name], [name]  [name] L 09/20/2012 4:30 PM

## 2012-09-20 NOTE — ED Provider Notes (Signed)
Medical screening examination/treatment/procedure(s) were performed by non-physician practitioner and as supervising physician I was immediately available for consultation/collaboration.  Chanceler Pullin, MD 09/20/12 1510 

## 2012-09-20 NOTE — Progress Notes (Signed)
Adult Psychoeducational Group Note  Date:  09/20/2012 Time:  1:53 PM  Group Topic/Focus:  Dimensions of Wellness:   The focus of this group is to introduce the topic of wellness and discuss the role each dimension of wellness plays in total health.  Participation Level:  Active  Participation Quality:  Appropriate  Affect:  Appropriate  Cognitive:  Appropriate  Insight: Appropriate  Engagement in Group:  Engaged  Modes of Intervention:  Discussion   Elijio Miles 09/20/2012, 1:53 PM

## 2012-09-20 NOTE — Progress Notes (Signed)
Patient ID: [name], male   DOB: November 25, 1990, 22 y.o.   MRN: [id number] [facility] MD Progress Note  09/20/2012 4:03 PM ASHTYN FREILICH  MRN:  [id number] Subjective:   Patient reports that he is feeling better rating his depression at one. He reports that his depression was seven on admission. Patient feels as though the Lexapro is helping his depression and anxiety. Patient has been attending groups and has been working to develop coping skills stating "I need to communicate better with people I care about. I actually talk better to strangers and that is not helping anything." Patient feels that living with his fiance was an added stressor and feels he needs another situation. Patient feels that "being away from the stress has helped me get a better perspective. I just have to start facing my stressors. I lost my last job because my anxiety got so high I just left. Then it was too late to get it back." The patient is feeling much more stable.   Diagnosis:   Axis I: Depressive disorder NOS, Anxiety Disorder NOS, Marijuana Abuse Axis II: Deferred Axis III:  Past Medical History  Diagnosis Date  . Psoriasis     right foot  . Asthma     triggered by cold weather; prn inhaler  . Anxiety     no current med.  . Fracture of metacarpal 02/13/2012    right small  . Depression     ADL's:  Intact  Sleep: Good  Appetite:  Good  Suicidal Ideation:  Patient denies any thought, plan, or intent Homicidal Ideation:  Patient denies any thought, plan, or intent AEB (as evidenced by):  Psychiatric Specialty Exam: Review of Systems  Constitutional: Negative.   HENT: Negative.   Eyes: Negative.   Respiratory: Negative.   Cardiovascular: Negative.   Genitourinary: Negative.   Musculoskeletal: Negative.   Skin: Negative.   Neurological: Negative.   Endo/Heme/Allergies: Negative.   Psychiatric/Behavioral: Positive for depression. Negative for suicidal ideas and hallucinations. The patient is  nervous/anxious. The patient does not have insomnia.     Blood pressure 114/73, pulse 59, temperature 97.8 F (36.6 C), temperature source Oral, resp. rate 18, height 5' 6.14" (1.68 m), weight 54.885 kg (121 lb), SpO2 99.00%.Body mass index is 19.45 kg/(m^2).  General Appearance: Casual  Eye Contact::  Good  Speech:  Clear and Coherent  Volume:  Decreased  Mood:  Dysphoric  Affect:  Congruent  Thought Process:  Linear and Logical  Orientation:  Full (Time, Place, and Person)  Thought Content:  WDL  Suicidal Thoughts:  No  Homicidal Thoughts:  No  Memory:  Immediate;   Good Recent;   Good Remote;   Good  Judgement:  Fair  Insight:  Fair  Psychomotor Activity:  Normal  Concentration:  Good  Recall:  Good  Akathisia:  No  Handed:  Right  AIMS (if indicated):     Assets:  Communication Skills Desire for Improvement Physical Health  Sleep:  Number of Hours: 6   Current Medications: Current Facility-Administered Medications  Medication Dose Route Frequency Provider Last Rate Last Dose  . acetaminophen (TYLENOL) tablet 650 mg  650 mg Oral Q6H PRN Earney Navy, NP   650 mg at 09/19/12 1944  . albuterol (PROVENTIL HFA;VENTOLIN HFA) 108 (90 BASE) MCG/ACT inhaler 2 puff  2 puff Inhalation Q4H PRN Court Joy, PA-C      . alum & mag hydroxide-simeth (MAALOX/MYLANTA) 200-200-20 MG/5ML suspension 30 mL  30 mL Oral  Q4H PRN Earney Navy, NP      . escitalopram (LEXAPRO) tablet 10 mg  10 mg Oral Daily Earney Navy, NP   10 mg at 09/20/12 0833  . hydrOXYzine (ATARAX/VISTARIL) tablet 25 mg  25 mg Oral Q6H PRN Jorje Guild, PA-C   25 mg at 09/20/12 1307  . magnesium hydroxide (MILK OF MAGNESIA) suspension 30 mL  30 mL Oral Daily PRN Earney Navy, NP      . nicotine (NICODERM CQ - dosed in mg/24 hours) patch 21 mg  21 mg Transdermal Daily Nehemiah Settle, MD   21 mg at 09/20/12 1305  . traZODone (DESYREL) tablet 50 mg  50 mg Oral QHS PRN Jorje Guild, PA-C   50 mg  at 09/19/12 2317    Lab Results: No results found for this or any previous visit (from the past 48 hour(s)).  Physical Findings: AIMS:  , ,  ,  ,    CIWA:    COWS:     Treatment Plan Summary: Daily contact with patient to assess and evaluate symptoms and progress in treatment Medication management  Plan: Continue crisis management and stabilization.  Medication management: Continue Lexapro 10 mg daily. Vistaril as needed for anxiety.  Encouraged patient to attend groups and participate in group counseling sessions and activities.  Address health issues: Vitals reviewed and stable.  Continue current plan of care. Anticipate d/c 1-2 days.   Discharge plan in progress.  Continue current treatment plan. Medical Decision Making Problem Points:  Established problem, stable/improving (1), Review of last therapy session (1) and Review of psycho-social stressors (1) Data Points:  Review or order clinical lab tests (1) Review of medication regiment & side effects (2)  I certify that inpatient services furnished can reasonably be expected to improve the patient's condition.   Terie Lear  NP-C  09/20/2012, 4:03 PM

## 2012-09-20 NOTE — Progress Notes (Signed)
Patient ID: [name], male   DOB: 04-18-1990, 22 y.o.   MRN: [id number] D- Patient reports poor sleep and good appetite.  His energy level is normal and his ability to pay attention is good.  He rates his depression at 2 and this morning was not feeling anxious.  Patient was walking in the hall during grief loss group.  Asked patient why he was not in group and he stated "it is too sad, grief and loss, it was bringing me down. "  Encouraged pt to go to group or go to his room to process worksheet for today.  Also talked with him earlier about continuing smoking cessation after discharge. Later patient requested vistaril for anxiety.  His affect is flat.  He is soft spoken and polite.

## 2012-09-21 DIAGNOSIS — F332 Major depressive disorder, recurrent severe without psychotic features: Principal | ICD-10-CM

## 2012-09-21 MED ORDER — HYDROXYZINE HCL 25 MG PO TABS: 25.0000 mg | ORAL_TABLET | Freq: Four times a day (QID) | ORAL | Status: DC | PRN

## 2012-09-21 MED ORDER — TRAZODONE HCL 50 MG PO TABS: 50.0000 mg | ORAL_TABLET | Freq: Every evening | ORAL | Status: DC | PRN

## 2012-09-21 MED ORDER — ESCITALOPRAM OXALATE 10 MG PO TABS: 10.0000 mg | ORAL_TABLET | Freq: Every day | ORAL | Status: DC

## 2012-09-21 MED ORDER — NICOTINE 21 MG/24HR TD PT24
MEDICATED_PATCH | TRANSDERMAL | Status: AC
  Filled 2012-09-21: qty 1

## 2012-09-21 NOTE — Progress Notes (Signed)
Patient ID: [name], male   DOB: 12/07/1990, 22 y.o.   MRN: [id number] Patient was discharged ambulatory to ride home with his children's mother.  He denies SI/HI.  He verbalizes understanding of his discharge meds and followup. He was given scripts and a supply of meds by MD.  He says he is planning to try to stay calm and walk away from upsetting situations until he feels he can handle them.

## 2012-09-21 NOTE — Discharge Summary (Signed)
Physician Discharge Summary Note  Patient:  [name] is an 22 y.o., male MRN:  [id number] DOB:  08-11-1990 Patient phone:  [phone] (home)  Patient address:   [address] [place] [address],   Date of Admission:  09/17/2012 Date of Discharge: 09/21/2012  Reason for Admission:  Intentional overdose  Discharge Diagnoses: Active Problems:   Depression   Anxiety state, unspecified   Marijuana abuse  Review of Systems  Constitutional: Negative.   HENT: Negative.   Eyes: Negative.   Respiratory: Negative.   Cardiovascular: Negative.   Gastrointestinal: Negative.   Genitourinary: Negative.   Musculoskeletal: Negative.   Skin: Negative.   Neurological: Negative.   Endo/Heme/Allergies: Negative.   Psychiatric/Behavioral: Positive for depression. The patient is nervous/anxious.    Axis Diagnosis:   AXIS I:  Major Depression, Recurrent severe AXIS II:  Deferred AXIS III:   Past Medical History  Diagnosis Date  . Psoriasis     right foot  . Asthma     triggered by cold weather; prn inhaler  . Anxiety     no current med.  . Fracture of metacarpal 02/13/2012    right small  . Depression    AXIS IV:  other psychosocial or environmental problems, problems related to social environment and problems with primary support group AXIS V:  61-70 mild symptoms  Level of Care:  OP  Hospital Course:  On admission:  Patient lost his job as had various stressful mornings walked out without letting them know. Was fired six weeks ago. He is on probation for embezzlement in 2010. Has to pay 1,700 restitution. He is close to losing his house as can't pay the mortgage. He is having conflictive interactions with the mother of his 2 daughters. They fight a lot. He was very stressed, shot down, could not get motivated. He went to urgent care and got Ativan, Celexa, sometime in June. The stressed kept building up. He OD on the Celexa. He slept it over did not tell anyone. He states  he can not take it anymore. He does not have a car available to him, he lends the car to his children's mother. Smokes marijuana couple of times a week, used to smoke more as a teenager. Occasional beer (family history of alcoholism, tries to avoid it) States she is very controlling. She takes the car, lets him with the kids, [place] ar [place] the [place] he cant get out to find work. States that when the baby was borned her mother was wanting to put her up for adoption, he agreed but then she changed her mind. They kept the baby. States things were not working out the four of them living together. He eventually left with the youngest to stay with his family. She kept the oldest and went her own way. He was still seeing the oldest daughter. He moved into a house owned by his mother, took over [employer]. Got the job that he liked. Was getting himself back on track when she convinced him to get back together. Since then he feels things have been going down hill again.He states she brings a lot of stress to his life. He went to see the probation officer and told him how he was feeling, he was recommended to come for help.  During hospitalization:  Medications managed--Albuterol 2 puffs every four hours for SOB continued.  Lexapro 10 mg daily for depression, Vistaril 25 mg every six hours PRN anxiety, and Trazodone 50 mg, repeat x  1, for sleep PRN.  He attended and participated in group therapy.  His depression and anxiety decreased, symptoms improved.  Patient denied suicidal/homicidal ideations and auditory/visual hallucinations, follow-up appointments encouraged to attend, outside support groups encouraged and information given, Rx given at discharge.  [place]ilton is mentally and physically stable for discharge.  Consults:  None  Significant Diagnostic Studies:  labs: Completed in ED, reviewed, stable  Discharge Vitals:   Blood pressure 114/69, pulse 96, temperature 97.8 F (36.6 C), temperature source Oral,  resp. rate 16, height 5' 6.14" (1.68 m), weight 54.885 kg (121 lb), SpO2 99.00%. Body mass index is 19.45 kg/(m^2). Lab Results:   No results found for this or any previous visit (from the past 72 hour(s)).  Physical Findings: AIMS: Facial and Oral Movements Muscles of Facial Expression: None, normal Lips and Perioral Area: None, normal Jaw: None, normal Tongue: None, normal,Extremity Movements Upper (arms, wrists, hands, fingers): None, normal Lower (legs, knees, ankles, toes): None, normal, Trunk Movements Neck, shoulders, hips: None, normal, Overall Severity Severity of abnormal movements (highest score from questions above): None, normal Incapacitation due to abnormal movements: None, normal Patient's awareness of abnormal movements (rate only patient's report): No Awareness,    CIWA:    COWS:     Psychiatric Specialty Exam: See Psychiatric Specialty Exam and Suicide Risk Assessment completed by Attending Physician prior to discharge.  Discharge destination:  Home  Is patient on multiple antipsychotic therapies at discharge:  No   Has Patient had three or more failed trials of antipsychotic monotherapy by history:  No Recommended Plan for Multiple Antipsychotic Therapies:  N/A  Discharge Orders   Future Orders Complete By Expires     Activity as tolerated - No restrictions  As directed     Diet - low sodium heart healthy  As directed         Medication List       Indication   albuterol 108 (90 BASE) MCG/ACT inhaler  Commonly known as:  PROVENTIL HFA;VENTOLIN HFA  Inhale 2 puffs into the lungs every 4 (four) hours as needed for wheezing or shortness of breath.      escitalopram 10 MG tablet  Commonly known as:  LEXAPRO  Take 1 tablet (10 mg total) by mouth daily.   Indication:  Depression, Generalized Anxiety Disorder     hydrOXYzine 25 MG tablet  Commonly known as:  ATARAX/VISTARIL  Take 1 tablet (25 mg total) by mouth every 6 (six) hours as needed for anxiety.       traZODone 50 MG tablet  Commonly known as:  DESYREL  Take 1 tablet (50 mg total) by mouth at bedtime as needed for sleep (MR x 1 if no effect).   Indication:  Trouble Sleeping           Follow-up Information   Follow up with Boneta Lucks - [facility] On 09/30/2012. (You are scheduled with Boneta Lucks on Thursday, September 30, 2012 at [facility].  Please arrive at 8:30 with completed registration packet.)    Contact information:   [address] [place], [place]   [address]  [phone]      Follow up with Jorje Guild, PA - [facility] On 11/02/2012. (You are scheduled with Jorje Guild on on Tuesday, November 02, 2012 at 4 PM.  You will be placed on a cancellation list for an earlier appointment)    Contact information:   [phone]      Follow-up recommendations:  Activity:  As tolerated  Diet:  Low-sodium heart healthy diet Continue to work on the life style change that would better help manage your mood Stress management Comments:  Patient will continue his care at out-patient at [facility] for his care.  Total Discharge Time:  Greater than 30 minutes.  SignedNanine Means, PMH-NP 09/21/2012, 9:15 AM Agree with assessment and plan Reymundo Poll. Dub Mikes, M.D.

## 2012-09-21 NOTE — Discharge Instructions (Signed)
Depression  You have signs of depression. This is a common problem. It can occur at any age. It is often hard to recognize. People can suffer from depression and still have moments of enjoyment. Depression interferes with your basic ability to function in life. It upsets your relationships, sleep, eating, and work habits.  CAUSES   Depression is believed to be caused by an imbalance in brain chemicals. It may be triggered by an unpleasant event. Relationship crises, a death in the family, financial worries, retirement, or other stressors are normal causes of depression. Depression may also start for no known reason. Other factors that may play a part include medical illnesses, some medicines, genetics, and alcohol or drug abuse.  SYMPTOMS    Feeling unhappy or worthless.   Long-lasting (chronic) tiredness or worn-out feeling.   Self-destructive thoughts and actions.   Not being able to sleep or sleeping too much.   Eating more than usual or not eating at all.   Headaches or feeling anxious.   Trouble concentrating or making decisions.   Unexplained physical problems and substance abuse.  TREATMENT   Depression usually gets better with treatment. This can include:   Antidepressant medicines. It can take weeks before the proper dose is achieved and benefits are reached.   Talking with a therapist, clergyperson, counselor, or friend. These people can help you gain insight into your problem and regain control of your life.   Eating a good diet.   Getting regular physical exercise, such as walking for 30 minutes every day.   Not abusing alcohol or drugs.  Treating depression often takes 6 months or longer. This length of treatment is needed to keep symptoms from returning. Call your caregiver and arrange for follow-up care as suggested.  SEEK IMMEDIATE MEDICAL CARE IF:    You start to have thoughts of hurting yourself or others.   Call your local emergency services (911 in [place]   Go to your local  medical emergency department.   Call the National Suicide Prevention Lifeline: 1-800-273-TALK (1-800-273-8255).  Document Released: 02/17/2005 Document Revised: 02/06/2011 Document Reviewed: 07/20/2009  ExitCare Patient Information 2012 [employer].

## 2012-09-21 NOTE — [facility] Suicide Risk Assessment (Signed)
Suicide Risk Assessment  Discharge Assessment     Demographic Factors:  Male, Adolescent or young adult and Caucasian  Mental Status Per Nursing Assessment::   On Admission:  Self-harm thoughts;Self-harm behaviors  Current Mental Status by Physician: In full contact with reality. There are no suicidal ideas plans or intent. His mood is euthymic, his affect is appropriate. States he is going to find different accommodations and not be with his "baby's mother." Admits that she triggers him. He is going to try to get his old job back. If not he has other options he is considering. He states that he is going to do all the right things for his kids.   Loss Factors: Loss of significant relationship and Financial problems/change in socioeconomic status  Historical Factors: NA  Risk Reduction Factors:   Responsible for children under 74 years of age, Sense of responsibility to family and Positive social support  Continued Clinical Symptoms:  Severe Anxiety and/or Agitation Depression:   Comorbid alcohol abuse/dependence  Cognitive Features That Contribute To Risk: No evidence   Suicide Risk:  Minimal: No identifiable suicidal ideation.  Patients presenting with no risk factors but with morbid ruminations; may be classified as minimal risk based on the severity of the depressive symptoms  Discharge Diagnoses:   AXIS I:  Anxiety Disorder NOS and Depressive Disorder NOS, Marijuana Abuse AXIS II:  Deferred AXIS III:   Past Medical History  Diagnosis Date  . Psoriasis     right foot  . Asthma     triggered by cold weather; prn inhaler  . Anxiety     no current med.  . Fracture of metacarpal 02/13/2012    right small  . Depression    AXIS IV:  occupational problems and other psychosocial or environmental problems AXIS V:  61-70 mild symptoms  Plan Of Care/Follow-up recommendations:  Activity:  as tolerated Diet:  regular Follow up outpatient basis Is patient on multiple  antipsychotic therapies at discharge:  No   Has Patient had three or more failed trials of antipsychotic monotherapy by history:  No  Recommended Plan for Multiple Antipsychotic Therapies: N/A   Angelo Caroll A 09/21/2012, 11:59 AM

## 2012-09-21 NOTE — Progress Notes (Signed)
Patient ID: [name], male   DOB: [birth year], 22 y.o.   MRN: [id number]  D: When asked about his day pt stated, "I'm motivated". Pt stated, "I don't think I need a whole week", referring to the discharge. "I knew that it was going to take 3 days, so I just signed anyway."   A:  Support and encouragement was offered. 15 min checks continued for safety.  R: Pt remains safe.

## 2012-09-21 NOTE — Progress Notes (Signed)
[facility] Adult Case Management Discharge Plan :  Will you be returning to the same living situation after discharge: Yes,  Patient is returning to his home. At discharge, do you have transportation home?:Yes,  Patient reports he has transportation home. Do you have the ability to pay for your medications:No.  Patient has no source of income.  He will need assistance with indigent medications.  Release of information consent forms completed and in the chart;  Patient's signature needed at discharge.  Patient to Follow up at: Follow-up Information   Follow up with Boneta Lucks - [facility] On 09/30/2012. (You are scheduled with Boneta Lucks on Thursday, September 30, 2012 at [facility].  Please arrive at 8:30 with completed registration packet.)    Contact information:   [address] [place], [place]   [address]  [phone]      Follow up with Jorje Guild, PA - [facility] On 11/02/2012. (You are scheduled with Jorje Guild on on Tuesday, November 02, 2012 at 4 PM.  You will be placed on a cancellation list for an earlier appointment)    Contact information:   [phone]      Patient denies SI/HI:  Patient no longer endorsing SI/HI or other thoughts of self harm.     Safety Planning and Suicide Prevention discussed: .Reviewed with all patients during discharge planning group  Cami Delawder, [place]es[place] July 09/21/2012, 10:11 AM

## 2012-09-21 NOTE — Progress Notes (Signed)
Adult Psychoeducational Group Note  Date:  09/21/2012 Time:  1:05 AM  Group Topic/Focus:  Wrap-Up Group:   The focus of this group is to help patients review their daily goal of treatment and discuss progress on daily workbooks.  Participation Level:  Active  Participation Quality:  Appropriate  Affect:  Appropriate  Cognitive:  Appropriate  Insight: Appropriate  Engagement in Group:  Engaged  Modes of Intervention:  Discussion  Additional Comments:  Patient reported that his energy had been increased today. Patient stated that he has learned to deal with stressors "instead of isolating." Patient lists avoiding stressful conversations, exercise, and meditation as coping mechanisms to deal with stress.   Rodman Pickle R 09/21/2012, 1:05 AM

## 2012-09-22 NOTE — Progress Notes (Signed)
Agree with assessment and plan Audrea Bolte A. Toshie Demelo, M.D. 

## 2012-09-24 NOTE — Progress Notes (Signed)
Patient Discharge Instructions:  Next Level Care Provider Has Access to the EMR, 09/24/12 Records provided to [facility] via [facility]/Epic access.  Jerelene Redden, 09/24/2012, 2:38 PM

## 2012-09-30 ENCOUNTER — Ambulatory Visit (HOSPITAL_COMMUNITY): Payer: Self-pay | Admitting: Psychiatry

## 2012-10-01 ENCOUNTER — Telehealth (HOSPITAL_COMMUNITY): Payer: Self-pay

## 2012-10-01 NOTE — Telephone Encounter (Signed)
10/01/12 12:54pm Recd vm from pt's insurance company - Judie [employer] checking to see if pateitn kept his schedule appt on 07/31 - rtn called left msg that pt has r/s for 09/02. [phone] [id number].Marland KitchenMarguerite Olea

## 2012-11-02 ENCOUNTER — Ambulatory Visit (HOSPITAL_COMMUNITY): Payer: BC Managed Care – PPO | Admitting: Physician Assistant

## 2012-11-24 ENCOUNTER — Emergency Department (HOSPITAL_COMMUNITY)
Admission: EM | Admit: 2012-11-24 | Discharge: 2012-11-24 | Disposition: A | Discharge status: Other Acute Inpatient Hospital | Payer: BC Managed Care – PPO | Attending: Emergency Medicine | Admitting: Emergency Medicine

## 2012-11-24 ENCOUNTER — Encounter (HOSPITAL_COMMUNITY): Payer: Self-pay | Admitting: Emergency Medicine

## 2012-11-24 ENCOUNTER — Inpatient Hospital Stay (HOSPITAL_COMMUNITY)
Admission: AD | Admit: 2012-11-24 | Discharge: 2012-11-29 | DRG: 430 | Disposition: A | Discharge status: Home / Self Care | Payer: BC Managed Care – PPO | Source: Intra-hospital | Attending: Psychiatry | Admitting: Psychiatry

## 2012-11-24 ENCOUNTER — Encounter (HOSPITAL_COMMUNITY): Payer: Self-pay | Admitting: *Deleted

## 2012-11-24 DIAGNOSIS — Z8781 Personal history of (healed) traumatic fracture: Secondary | ICD-10-CM | POA: Insufficient documentation

## 2012-11-24 DIAGNOSIS — F1210 Cannabis abuse, uncomplicated: Secondary | ICD-10-CM

## 2012-11-24 DIAGNOSIS — J45909 Unspecified asthma, uncomplicated: Secondary | ICD-10-CM | POA: Insufficient documentation

## 2012-11-24 DIAGNOSIS — F329 Major depressive disorder, single episode, unspecified: Secondary | ICD-10-CM

## 2012-11-24 DIAGNOSIS — Z79899 Other long term (current) drug therapy: Secondary | ICD-10-CM | POA: Insufficient documentation

## 2012-11-24 DIAGNOSIS — F3289 Other specified depressive episodes: Secondary | ICD-10-CM | POA: Insufficient documentation

## 2012-11-24 DIAGNOSIS — R45851 Suicidal ideations: Secondary | ICD-10-CM

## 2012-11-24 DIAGNOSIS — F17200 Nicotine dependence, unspecified, uncomplicated: Secondary | ICD-10-CM | POA: Insufficient documentation

## 2012-11-24 DIAGNOSIS — Z87898 Personal history of other specified conditions: Secondary | ICD-10-CM | POA: Diagnosis present

## 2012-11-24 DIAGNOSIS — F332 Major depressive disorder, recurrent severe without psychotic features: Principal | ICD-10-CM | POA: Diagnosis present

## 2012-11-24 DIAGNOSIS — Z872 Personal history of diseases of the skin and subcutaneous tissue: Secondary | ICD-10-CM | POA: Insufficient documentation

## 2012-11-24 DIAGNOSIS — Z8659 Personal history of other mental and behavioral disorders: Secondary | ICD-10-CM | POA: Diagnosis present

## 2012-11-24 DIAGNOSIS — F411 Generalized anxiety disorder: Secondary | ICD-10-CM | POA: Diagnosis present

## 2012-11-24 LAB — CBC WITH DIFFERENTIAL/PLATELET
Basophils Absolute: 0.1 10*3/uL (ref 0.0–0.1)
Basophils Relative: 1 % (ref 0–1)
Eosinophils Absolute: 0.5 10*3/uL (ref 0.0–0.7)
Eosinophils Relative: 6 % — ABNORMAL HIGH (ref 0–5)
HCT: 43.9 % (ref 39.0–52.0)
Hemoglobin: 15.1 g/dL (ref 13.0–17.0)
Lymphocytes Relative: 20 % (ref 12–46)
Lymphs Abs: 1.6 10*3/uL (ref 0.7–4.0)
MCH: 30.3 pg (ref 26.0–34.0)
MCHC: 34.4 g/dL (ref 30.0–36.0)
MCV: 88 fL (ref 78.0–100.0)
Monocytes Absolute: 0.9 10*3/uL (ref 0.1–1.0)
Monocytes Relative: 11 % (ref 3–12)
Neutro Abs: 5.1 10*3/uL (ref 1.7–7.7)
Neutrophils Relative %: 62 % (ref 43–77)
Platelets: 256 10*3/uL (ref 150–400)
RBC: 4.99 MIL/uL (ref 4.22–5.81)
RDW: 12.7 % (ref 11.5–15.5)
WBC: 8.1 10*3/uL (ref 4.0–10.5)

## 2012-11-24 LAB — URINALYSIS, ROUTINE W REFLEX MICROSCOPIC
Bilirubin Urine: NEGATIVE
Glucose, UA: NEGATIVE mg/dL
Ketones, ur: NEGATIVE mg/dL
Nitrite: NEGATIVE
Protein, ur: NEGATIVE mg/dL
Urobilinogen, UA: 0.2 mg/dL (ref 0.0–1.0)
pH: 7.5 (ref 5.0–8.0)

## 2012-11-24 LAB — COMPREHENSIVE METABOLIC PANEL
ALT: 13 U/L (ref 0–53)
Alkaline Phosphatase: 45 U/L (ref 39–117)
BUN: 8 mg/dL (ref 6–23)
CO2: 27 mEq/L (ref 19–32)
Chloride: 105 mEq/L (ref 96–112)
GFR calc Af Amer: >90 mL/min (ref >90)
GFR calc non Af Amer: >90 mL/min (ref >90)
Glucose, Bld: 86 mg/dL (ref 70–99)
Potassium: 3.8 mEq/L (ref 3.5–5.1)
Sodium: 141 mEq/L (ref 135–145)
Total Bilirubin: 0.5 mg/dL (ref 0.3–1.2)

## 2012-11-24 LAB — ETHANOL: Alcohol, Ethyl (B): <11 mg/dL (ref 0–11)

## 2012-11-24 MED ORDER — ALBUTEROL SULFATE HFA 108 (90 BASE) MCG/ACT IN AERS
2.0000 | INHALATION_SPRAY | RESPIRATORY_TRACT | Status: DC | PRN
  Administered 2012-11-24: 2 via RESPIRATORY_TRACT
  Filled 2012-11-24: qty 6.7

## 2012-11-24 MED ORDER — ALUM & MAG HYDROXIDE-SIMETH 200-200-20 MG/5ML PO SUSP: 30.0000 mL | ORAL | Status: DC | PRN

## 2012-11-24 MED ORDER — IBUPROFEN 200 MG PO TABS: 600.0000 mg | ORAL_TABLET | Freq: Three times a day (TID) | ORAL | Status: DC | PRN

## 2012-11-24 MED ORDER — ACETAMINOPHEN 325 MG PO TABS: 650.0000 mg | ORAL_TABLET | Freq: Four times a day (QID) | ORAL | Status: DC | PRN

## 2012-11-24 MED ORDER — ZOLPIDEM TARTRATE 5 MG PO TABS: 5.0000 mg | ORAL_TABLET | Freq: Every evening | ORAL | Status: DC | PRN

## 2012-11-24 MED ORDER — ONDANSETRON HCL 4 MG PO TABS: 4.0000 mg | ORAL_TABLET | Freq: Three times a day (TID) | ORAL | Status: DC | PRN

## 2012-11-24 MED ORDER — ALBUTEROL SULFATE HFA 108 (90 BASE) MCG/ACT IN AERS: 2.0000 | INHALATION_SPRAY | RESPIRATORY_TRACT | Status: DC | PRN

## 2012-11-24 MED ORDER — TRAZODONE HCL 50 MG PO TABS
50.0000 mg | ORAL_TABLET | Freq: Every evening | ORAL | Status: DC | PRN
  Administered 2012-11-24 – 2012-11-28 (×6): 50 mg via ORAL
  Filled 2012-11-24 (×6): qty 1

## 2012-11-24 MED ORDER — NICOTINE 21 MG/24HR TD PT24
21.0000 mg | MEDICATED_PATCH | Freq: Every day | TRANSDERMAL | Status: DC
  Administered 2012-11-24: 21 mg via TRANSDERMAL
  Filled 2012-11-24: qty 1

## 2012-11-24 MED ORDER — MAGNESIUM HYDROXIDE 400 MG/5ML PO SUSP: 30.0000 mL | Freq: Every day | ORAL | Status: DC | PRN

## 2012-11-24 MED ORDER — ALBUTEROL SULFATE HFA 108 (90 BASE) MCG/ACT IN AERS
2.0000 | INHALATION_SPRAY | RESPIRATORY_TRACT | Status: DC | PRN
  Administered 2012-11-24 – 2012-11-28 (×6): 2 via RESPIRATORY_TRACT
  Filled 2012-11-24: qty 6.7

## 2012-11-24 MED ORDER — LORAZEPAM 1 MG PO TABS: 1.0000 mg | ORAL_TABLET | Freq: Three times a day (TID) | ORAL | Status: DC | PRN

## 2012-11-24 NOTE — ED Notes (Signed)
Patient in blue scrubs and red socks.  

## 2012-11-24 NOTE — Progress Notes (Signed)
Patient ID: [name], male   DOB: Mar 18, 1990, 22 y.o.   MRN: [id number] Pt. Is a 22 yo male admitted for SI and depression. Pt. Has been having thoughts of shooting self, but currently contracts for safety. Pt. Reports stressor as unemployment and losing his house. Pt. Currently lives with friends. Per counselor pt. Reports the only thing that keeps him from acting on these thoughts is "my babies." He reports he has a 38 year old and a three year old daughter. His 66 year old is in the custody of her mother. He raised the 20 year old from infancy because her mother didn't want her, but had to send her to live with father in [place] because he lost his job and his home and couldn't take care of her.  Pt. Pleasant during this admission, but a little preoccupied. Pt. Says he was here at [facility] in July "for the same thing depression, anxiety." Pt. Has a medical hx. Of psoriasis and asthma. Staff oriented to unit/room, pt. Given snacks/drink. Writer reviewed night medications. Staff will be monitored q80min for safety.

## 2012-11-24 NOTE — ED Provider Notes (Signed)
Medical screening examination/treatment/procedure(s) were performed by non-physician practitioner and as supervising physician I was immediately available for consultation/collaboration.  Flint Melter, MD 11/24/12 2255

## 2012-11-24 NOTE — BH Assessment (Signed)
Tele Assessment Note   [name] is an 22 y.o. male who presents to [facility] seeking treatment for anxiety and depression.  He reports that he's been feeling suicidal for a few months and he's starting to think of plans.  He currently reports thinking of using a gun to shoot himself in the head.  He denies current possession of firearms, but reports that he knows how he could obtain one.  The only thing that keeps him from acting on these thoughts is "my babies." He reports he has a 16 year old and a three year old daughter.  His 18 year old is in the custody of her mother.  He raised the 61 year old from infancy because her mother didn't want her, but had to send her to live with his father in [place] because he lost his job and his home and couldn't take care of her.  He is currently staying with friends.  He reports the following depressive symptoms-feelings of worthlessness, anger, irritability, anhedonia, tearfulness, feelings of guilt, decreased grooming, decreased concentration, isolating behavior.  He also reports an increase in anxiety, although he denies current panic attacks-he states he feels frustrated and tense and like he's screaming inside.  He denies any thoughts of homicide or history of violence.  He was arrested for assault, but released and never charged.  This caused him to miss his appointment with psychiatrist after being discharged from [facility] in July for SI.  After being released from jail he overdosed on trazodone in an attempt to end his life.  He is unable to contract for safety and meets criteria for inpatient admission.  He denies regular SA, but does report increasing alcohol usage related to his anxiety and depression.  He currently reports drinkinga 6 pack a few times a week.  [name] accepted the patient to the service of Dr [name].  He will be transported by security.  ED staff notified.    Axis I: Anxiety Disorder NOS and Major Depression, Recurrent severe Axis II:  Deferred Axis III:  Past Medical History  Diagnosis Date  . Psoriasis     right foot  . Asthma     triggered by cold weather; prn inhaler  . Anxiety     no current med.  . Fracture of metacarpal 02/13/2012    right small  . Depression    Axis IV: economic problems, housing problems, occupational problems, problems related to legal system/crime, problems with access to health care services and problems with primary support group Axis V: 31-40 impairment in reality testing  Past Medical History:  Past Medical History  Diagnosis Date  . Psoriasis     right foot  . Asthma     triggered by cold weather; prn inhaler  . Anxiety     no current med.  . Fracture of metacarpal 02/13/2012    right small  . Depression     Past Surgical History  Procedure Laterality Date  . Tonsillectomy  age 48  . Closed reduction finger with percutaneous pinning  02/16/2012    Procedure: CLOSED REDUCTION FINGER WITH PERCUTANEOUS PINNING;  Surgeon: Marlowe Shores, MD;  Location: [facility];  Service: Orthopedics;  Laterality: Right;  CLOSED REDUCTION PINNING RT SMALL METACARPAL FX.    Family History: No family history on file.  Social History:  reports that he has been smoking Cigarettes.  He has a 6 pack-year smoking history. He has never used smokeless tobacco. He reports that  drinks alcohol. He reports that he does not use illicit drugs.  Additional Social History:  Alcohol / Drug Use History of alcohol / drug use?: No history of alcohol / drug abuse  CIWA: CIWA-Ar BP: 116/75 mmHg Pulse Rate: 57 COWS:    Allergies: No Known Allergies  Home Medications:  (Not in a hospital admission)  OB/GYN Status:  No LMP for male patient.  General Assessment Data Location of Assessment: [facility] Is this a Tele or Face-to-Face Assessment?: Tele Assessment Is this an Initial Assessment or a Re-assessment for this encounter?: Initial Assessment Living Arrangements:  Non-relatives/Friends Can pt return to current living arrangement?: Yes Admission Status: Voluntary Is patient capable of signing voluntary admission?: Yes Transfer from: Acute Hospital Referral Source: Self/Family/Friend     [facility] Crisis Care Plan Living Arrangements: Non-relatives/Friends  Education Status Is patient currently in school?: No Highest grade of school patient has completed: one semester of college  Risk to self Suicidal Ideation: Yes-Currently Present Suicidal Intent: No-Not Currently/Within Last 6 Months Is patient at risk for suicide?: Yes Suicidal Plan?: Yes-Currently Present Specify Current Suicidal Plan: shoot self in the head w gun Access to Means: No (could obtain) What has been your use of drugs/alcohol within the last 12 months?: drinking 6 pack a few times per week Previous Attempts/Gestures: Yes How many times?: 1 Triggers for Past Attempts: Family contact;Other (Comment) Intentional Self Injurious Behavior: None Family Suicide History: No Recent stressful life event(s): Financial Problems;Loss (Comment);Legal Issues Persecutory voices/beliefs?: No Depression: Yes Depression Symptoms: Despondent;Insomnia;Tearfulness;Isolating;Fatigue;Guilt;Loss of interest in usual pleasures;Feeling angry/irritable;Feeling worthless/self pity Substance abuse history and/or treatment for substance abuse?: No Suicide prevention information given to non-admitted patients: Not applicable  Risk to Others Homicidal Ideation: No Thoughts of Harm to Others: No Current Homicidal Intent: No Current Homicidal Plan: No Access to Homicidal Means: No History of harm to others?: No Assessment of Violence: None Noted Does patient have access to weapons?: No Criminal Charges Pending?: No Does patient have a court date: No  Psychosis Hallucinations: None noted Delusions: None noted  Mental Status Report Appear/Hygiene: Disheveled Eye Contact: Poor Motor Activity: Freedom  of movement Speech: Soft;Slow Level of Consciousness: Quiet/awake Mood: Depressed Affect: Blunted;Depressed Anxiety Level: Severe Thought Processes: Coherent;Relevant Judgement: Unimpaired Orientation: Person;Place;Time;Situation Obsessive Compulsive Thoughts/Behaviors: Moderate  Cognitive Functioning Concentration: Decreased Memory: Recent Intact;Remote Intact IQ: Average Insight: Fair Impulse Control: Fair Appetite: Fair Weight Loss: 0 Weight Gain: 0 Sleep: Decreased Total Hours of Sleep: 5 Vegetative Symptoms: Staying in bed;Not bathing;Decreased grooming  ADLScreening [facility] Assessment Services) Patient's cognitive ability adequate to safely complete daily activities?: Yes Patient able to express need for assistance with ADLs?: Yes Independently performs ADLs?: Yes (appropriate for developmental age)  Prior Inpatient Therapy Prior Inpatient Therapy: Yes Prior Therapy Dates: July 2014, during teens Prior Therapy Facilty/Provider(s): [facility] Reason for Treatment: Depression, SI  Prior Outpatient Therapy Prior Outpatient Therapy: No  ADL Screening (condition at time of admission) Patient's cognitive ability adequate to safely complete daily activities?: Yes Patient able to express need for assistance with ADLs?: Yes Independently performs ADLs?: Yes (appropriate for developmental age)       Abuse/Neglect Assessment (Assessment to be complete while patient is alone) Physical Abuse: Denies Verbal Abuse: Denies Sexual Abuse: Denies Values / Beliefs Cultural Requests During Hospitalization: None Spiritual Requests During Hospitalization: None   Advance Directives (For Healthcare) Advance Directive: Patient does not have advance directive;Not applicable, patient <64 years old Nutrition Screen- [facility] Adult/[facility] Patient's home diet: Regular  Additional Information 1:1 In Past  12 Months?: No CIRT Risk: No Elopement Risk: No Does patient have medical clearance?: Yes      Disposition:  Disposition Initial Assessment Completed for this Encounter: Yes Disposition of Patient: Inpatient treatment program Type of inpatient treatment program: Adult  Steward Ros 11/24/2012 8:25 PM

## 2012-11-24 NOTE — ED Notes (Signed)
Per pt, was hospitalized for same symptoms in July-states he OD'ed after getting discharged-was in [place] for assault so he could not follow up with psychiatrist

## 2012-11-24 NOTE — Tx Team (Signed)
Initial Interdisciplinary Treatment Plan  PATIENT STRENGTHS: (choose at least two) Ability for insight Average or above average intelligence Capable of independent living Communication skills General fund of knowledge Supportive family/friends  PATIENT STRESSORS: Financial difficulties Health problems Occupational concerns   PROBLEM LIST: Problem List/Patient Goals Date to be addressed Date deferred Reason deferred Estimated date of resolution  Suicidal Ideations 11-24-12     Depression 11-24-12     Asthma 11-24-12     Psoraisis 11-24-12                                    DISCHARGE CRITERIA:  Improved stabilization in mood, thinking, and/or behavior Motivation to continue treatment in a less acute level of care Need for constant or close observation no longer present Reduction of life-threatening or endangering symptoms to within safe limits  PRELIMINARY DISCHARGE PLAN: Attend PHP/IOP Outpatient therapy Return to previous living arrangement  PATIENT/FAMIILY INVOLVEMENT: This treatment plan has been presented to and reviewed with the patient, NILAN IDDINGS, and/or family member.  The patient and family have been given the opportunity to ask questions and make suggestions.  Mickeal Needy 11/24/2012, 10:55 PM

## 2012-11-24 NOTE — ED Notes (Signed)
Patient complained of shortness of breath and feeling like he needed an inhaler. Patient has a history of using inhaler. Alease Frame RN called for an order for Albuterol.

## 2012-11-24 NOTE — Progress Notes (Signed)
EDCM attempted to see patient regarding pcp issues.  As per [facility] RN, patient having telepsych assessment at this time.

## 2012-11-24 NOTE — ED Notes (Signed)
Patient has 2 bag of belongings located behind nurses station in triage.-- Patient has 1 wallet with no money in it.

## 2012-11-24 NOTE — ED Provider Notes (Signed)
CSN: [id number]     Arrival date & time 11/24/12  1401 History   First MD Initiated Contact with Patient 11/24/12 1507     Chief Complaint  Patient presents with  . Medical Clearance   (Consider location/radiation/quality/duration/timing/severity/associated sxs/prior Treatment) HPI Comments: -year-old male with a past medical history of anxiety, depression, suicidal attempts and asthma presents to the emergency department with worsening depression and thoughts of suicide. Patient states back in July he was hospitalized for similar symptoms, after getting discharged from the hospital he overdosed on trazodone. He was told to followup with a psychiatrist, however the day before his appointment he was arrested for assault and incarcerated. Over the past few days his depression has worsened, has thoughts of suicide without any specific plan. States life is overwhelming, he lost his job and children and cannot handle it anymore. Denies drug or alcohol use.  The history is provided by the patient.    Past Medical History  Diagnosis Date  . Psoriasis     right foot  . Asthma     triggered by cold weather; prn inhaler  . Anxiety     no current med.  . Fracture of metacarpal 02/13/2012    right small  . Depression    Past Surgical History  Procedure Laterality Date  . Tonsillectomy  age 24  . Closed reduction finger with percutaneous pinning  02/16/2012    Procedure: CLOSED REDUCTION FINGER WITH PERCUTANEOUS PINNING;  Surgeon: Marlowe Shores, MD;  Location: [facility];  Service: Orthopedics;  Laterality: Right;  CLOSED REDUCTION PINNING RT SMALL METACARPAL FX.   No family history on file. History  Substance Use Topics  . Smoking status: Current Every Day Smoker -- 1.00 packs/day for 6 years    Types: Cigarettes  . Smokeless tobacco: Never Used  . Alcohol Use: Yes     Comment: occasionally    Review of Systems  Psychiatric/Behavioral: Positive for suicidal ideas and  dysphoric mood. Negative for self-injury.  All other systems reviewed and are negative.    Allergies  Review of patient's allergies indicates no known allergies.  Home Medications   Current Outpatient Rx  Name  Route  Sig  Dispense  Refill  . albuterol (PROVENTIL HFA;VENTOLIN HFA) 108 (90 BASE) MCG/ACT inhaler   Inhalation   Inhale 2 puffs into the lungs every 4 (four) hours as needed for wheezing or shortness of breath.           BP 112/66  Pulse 70  Temp(Src) 98.2 F (36.8 C) (Oral)  Resp 18  SpO2 98% Physical Exam  Nursing note and vitals reviewed. Constitutional: He is oriented to person, place, and time. He appears well-developed and well-nourished. No distress.  HENT:  Head: Normocephalic and atraumatic.  Mouth/Throat: Oropharynx is clear and moist.  Eyes: Conjunctivae and EOM are normal.  Neck: Normal range of motion. Neck supple.  Cardiovascular: Normal rate, regular rhythm and normal heart sounds.   Pulmonary/Chest: Effort normal and breath sounds normal.  Musculoskeletal: Normal range of motion. He exhibits no edema.  Neurological: He is alert and oriented to person, place, and time.  Skin: Skin is warm and dry. He is not diaphoretic.  Psychiatric: His speech is normal and behavior is normal. He exhibits a depressed mood. He expresses suicidal ideation. He expresses no homicidal ideation. He expresses no suicidal plans.    ED Course  Procedures (including critical care time) Labs Review Labs Reviewed  CBC WITH DIFFERENTIAL - Abnormal; Notable  for the following:    Eosinophils Relative 6 (*)    All other components within normal limits  URINALYSIS, ROUTINE W REFLEX MICROSCOPIC - Abnormal; Notable for the following:    APPearance CLOUDY (*)    All other components within normal limits  COMPREHENSIVE METABOLIC PANEL  ETHANOL  URINE RAPID DRUG SCREEN (HOSP PERFORMED)   Imaging Review No results found.  MDM   1. Depression   2. Suicidal ideation      Patient with SI, no plan. Psych labs pending. Consult to TTS. Normal vital signs, NAD. Medically cleared. 8:41 PM Patient will be transferred to behavioral health Hospital where he has been given a bed.  Trevor Mace, PA-C 11/24/12 2042

## 2012-11-24 NOTE — BH Assessment (Signed)
Pt accepted to [facility] for inpatient treatment by [name], NP assigned to bed 503-1 assigned to Dr. Elsie Saas for inpatient treatment. All Appropriate support documentation complete(consent to release, Voluntary Consent form,and Checklist faxed to [facility]. Original support documentation given to pt's nurse Latricia.  Glorious Peach, MS, LCASA Assessment Counselor

## 2012-11-24 NOTE — ED Notes (Signed)
Telecommunication w/TTS

## 2012-11-24 NOTE — ED Notes (Signed)
Patient and belongings both wanded by security.  

## 2012-11-25 DIAGNOSIS — F1210 Cannabis abuse, uncomplicated: Secondary | ICD-10-CM

## 2012-11-25 DIAGNOSIS — F411 Generalized anxiety disorder: Secondary | ICD-10-CM

## 2012-11-25 DIAGNOSIS — F332 Major depressive disorder, recurrent severe without psychotic features: Principal | ICD-10-CM

## 2012-11-25 MED ORDER — FLUOXETINE HCL 10 MG PO CAPS
10.0000 mg | ORAL_CAPSULE | Freq: Every day | ORAL | Status: DC
  Administered 2012-11-25 – 2012-11-29 (×5): 10 mg via ORAL
  Filled 2012-11-25: qty 1
  Filled 2012-11-25: qty 14
  Filled 2012-11-25 (×5): qty 1

## 2012-11-25 MED ORDER — NICOTINE 21 MG/24HR TD PT24
21.0000 mg | MEDICATED_PATCH | Freq: Every day | TRANSDERMAL | Status: DC
  Administered 2012-11-25 – 2012-11-28 (×4): 21 mg via TRANSDERMAL
  Filled 2012-11-25 (×8): qty 1

## 2012-11-25 MED ORDER — BUSPIRONE HCL 15 MG PO TABS
7.5000 mg | ORAL_TABLET | Freq: Two times a day (BID) | ORAL | Status: DC
  Administered 2012-11-25 – 2012-11-28 (×7): 7.5 mg via ORAL
  Filled 2012-11-25 (×10): qty 1

## 2012-11-25 NOTE — Progress Notes (Signed)
Recreation Therapy Notes  Date: 09.25.2014 Time: 2:45pm Location: [address] Dayroom  Group Topic: Software engineer Activities (AAA)  Behavioral Response: Did not attend  [employer], LRT/CTRS  Federick Levene L 11/25/2012 5:11 PM

## 2012-11-25 NOTE — BHH Suicide Risk Assessment (Signed)
[facility] Adult Inpatient Family/Significant Other Suicide Prevention Education  Suicide Prevention Education:   Patient Refusal for Family/Significant Other Suicide Prevention Education: The patient has refused to provide written consent for family/significant other to be provided Family/Significant Other Suicide Prevention Education during admission and/or prior to discharge.  Physician notified.  CSW provided suicide prevention information with patient.    The suicide prevention education provided includes the following:  Suicide risk factors  Suicide prevention and interventions  National Suicide Hotline telephone number  [facility] assessment telephone number  [facility] Emergency Assistance 911  [facility] and/or Residential Mobile Crisis Unit telephone number   Reyes Ivan, [place] 11/25/2012 10:20 AM

## 2012-11-25 NOTE — Progress Notes (Signed)
Adult Psychoeducational Group Note  Date:  11/25/2012 Time:  10:30AM Group Topic/Focus:  Leisure and Lifestyle Changes  Participation Level:  Did Not Attend   Additional Comments: Pt. Didn't attend group.   Bing Plume D 11/25/2012, 11:32 AM

## 2012-11-25 NOTE — H&P (Signed)
Psychiatric Admission Assessment Adult  Patient Identification:  [name] Date of Evaluation:  11/25/2012 Chief Complaint:  MAJOR DEPRESSIVE DISORDER History of Present Illness: [name] is an 22 y.o. Male, unemployed admitted voluntarily, emergently from [facility] for depression, anxiety and suicidal ideation with plan of shooting himself. He reportedly lives with friends. Patient stated that he has lost job in April, was admitted to [facility] in July, for OD on trazodone, lost his house due to financial problems and separated from daughter(2) who was placed in her grandpa home. He has been feeling suicidal for a few months and he's starting to think of plans. He currently reports thinking of using a gun to shoot himself in the head. He denies current possession of firearms, but reports that he knows how he could obtain one. His protecting factor from committing suicide is his babies. He reports he has a 71 year old and a three year old daughter. His 31 year old is in the custody of her mother. He raised the 57 year old from infancy because her mother didn't want her, but had to send her to live with his father in [place] because he lost his job and his home and couldn't take care of her. He reports the following depressive symptoms-feelings of worthlessness, anger, irritability, anhedonia, tearfulness, feelings of guilt, decreased grooming, decreased concentration, isolating behavior. He also reports an increase in anxiety, although he denies current panic attacks-he states he feels frustrated and tense and like he's screaming inside. He denies any thoughts of homicide or history of violence. He was arrested for assault with baby's mom in July 2014, but released and never charged. This caused him to miss his appointment with psychiatrist after being discharged from [facility] in July for SI. He does report increasing alcohol usage related to his anxiety and depression. He currently reports drinking a 6 pack a few  times a week.   Elements:  Location:  [facility] adult unit. Quality:  depression and anxiey. Severity:  suicidal ideation. Timing:  no job and separation. Duration:  six months. Context:  mulitiple financial stresss. Associated Signs/Synptoms: Depression Symptoms:  depressed mood, anhedonia, insomnia, psychomotor agitation, psychomotor retardation, fatigue, feelings of worthlessness/guilt, difficulty concentrating, hopelessness, impaired memory, recurrent thoughts of death, suicidal thoughts with specific plan, anxiety, weight loss, decreased labido, decreased appetite, (Hypo) Manic Symptoms:  Distractibility, Impulsivity, Anxiety Symptoms:  Excessive Worry, Psychotic Symptoms:  Denied  PTSD Symptoms: Had a traumatic exposure:  denied Re-experiencing:  None Hypervigilance:  No Hyperarousal:  Irritability/Anger Avoidance:  Foreshortened Future  Psychiatric Specialty Exam: Physical Exam  ROS  Blood pressure 98/60, pulse 98, temperature 98.4 F (36.9 C), temperature source Oral, resp. rate 16, height 5\' 5"  (1.651 m), weight 59.421 kg (131 lb).Body mass index is 21.8 kg/(m^2).  General Appearance: Guarded  Eye Contact::  Minimal  Speech:  Blocked and Clear and Coherent  Volume:  Decreased  Mood:  Anxious, Depressed, Hopeless, Irritable and Worthless  Affect:  Congruent, Constricted, Depressed and Restricted  Thought Process:  Coherent, Disorganized, Intact and Loose  Orientation:  Full (Time, Place, and Person)  Thought Content:  Rumination  Suicidal Thoughts:  Yes.  with intent/plan  Homicidal Thoughts:  No  Memory:  Immediate;   Fair  Judgement:  Fair  Insight:  Lacking  Psychomotor Activity:  Decreased and Restlessness  Concentration:  Fair  Recall:  Fair  Akathisia:  NA  Handed:  Right  AIMS (if indicated):     Assets:  Communication Skills Desire for  Improvement Leisure Time Physical Health Resilience Social Support  Sleep:  Number of Hours: 4.75     Past Psychiatric History: Diagnosis: Depression  Hospitalizations: [facility]   Outpatient Care: none  Substance Abuse Care: cannabis and alcohol  Self-Mutilation: none  Suicidal Attempts: yes, overdose  Violent Behaviors: yes against his baby's mom   Past Medical History:   Past Medical History  Diagnosis Date  . Psoriasis     right foot  . Asthma     triggered by cold weather; prn inhaler  . Anxiety     no current med.  . Fracture of metacarpal 02/13/2012    right small  . Depression    None. Allergies:  No Known Allergies PTA Medications: Prescriptions prior to admission  Medication Sig Dispense Refill  . albuterol (PROVENTIL HFA;VENTOLIN HFA) 108 (90 BASE) MCG/ACT inhaler Inhale 2 puffs into the lungs every 4 (four) hours as needed for wheezing or shortness of breath.         Previous Psychotropic Medications:  Medication/Dose  [facility] in July 2014, Lexapro and trazodone               Substance Abuse History in the last 12 months:  yes  Consequences of Substance Abuse: NA  Social History:  reports that he has been smoking Cigarettes.  He has a 6 pack-year smoking history. He has never used smokeless tobacco. He reports that  drinks alcohol. He reports that he does not use illicit drugs. Additional Social History: History of alcohol / drug use?: No history of alcohol / drug abuse   Current Place of Residence:   Place of Birth:   Family Members: Marital Status:  Single Children:  Sons:  Daughters: Relationships: Education:  [employer] Problems/Performance: Religious Beliefs/Practices: History of Abuse (Emotional/Phsycial/Sexual) Occupational Experiences; Military History:  None. Legal History: Hobbies/Interests:  Family History:  History reviewed. No pertinent family history.  Results for orders placed during the hospital encounter of 11/24/12 (from the past 72 hour(s))  CBC WITH DIFFERENTIAL     Status: Abnormal   Collection Time     11/24/12  2:35 PM      Result Value Range   WBC 8.1  4.0 - 10.5 K/uL   RBC 4.99  4.22 - 5.81 MIL/uL   Hemoglobin 15.1  13.0 - 17.0 g/dL   HCT 09.8  11.9 - 14.7 %   MCV 88.0  78.0 - 100.0 fL   MCH 30.3  26.0 - 34.0 pg   MCHC 34.4  30.0 - 36.0 g/dL   RDW 82.9  56.2 - 13.0 %   Platelets 256  150 - 400 K/uL   Neutrophils Relative % 62  43 - 77 %   Neutro Abs 5.1  1.7 - 7.7 K/uL   Lymphocytes Relative 20  12 - 46 %   Lymphs Abs 1.6  0.7 - 4.0 K/uL   Monocytes Relative 11  3 - 12 %   Monocytes Absolute 0.9  0.1 - 1.0 K/uL   Eosinophils Relative 6 (*) 0 - 5 %   Eosinophils Absolute 0.5  0.0 - 0.7 K/uL   Basophils Relative 1  0 - 1 %   Basophils Absolute 0.1  0.0 - 0.1 K/uL  COMPREHENSIVE METABOLIC PANEL     Status: None   Collection Time    11/24/12  2:35 PM      Result Value Range   Sodium 141  135 - 145 mEq/L   Potassium 3.8  3.5 - 5.1  mEq/L   Chloride 105  96 - 112 mEq/L   CO2 27  19 - 32 mEq/L   Glucose, Bld 86  70 - 99 mg/dL   BUN 8  6 - 23 mg/dL   Creatinine, Ser 1.61  0.50 - 1.35 mg/dL   Calcium 9.6  8.4 - 09.6 mg/dL   Total Protein 7.2  6.0 - 8.3 g/dL   Albumin 4.3  3.5 - 5.2 g/dL   AST 12  0 - 37 U/L   ALT 13  0 - 53 U/L   Alkaline Phosphatase 45  39 - 117 U/L   Total Bilirubin 0.5  0.3 - 1.2 mg/dL   GFR calc non Af Amer >90  >90 mL/min   GFR calc Af Amer >90  >90 mL/min   Comment: (NOTE)     The eGFR has been calculated using the CKD EPI equation.     This calculation has not been validated in all clinical situations.     eGFR's persistently <90 mL/min signify possible Chronic Kidney     Disease.  ETHANOL     Status: None   Collection Time    11/24/12  2:35 PM      Result Value Range   Alcohol, Ethyl (B) <11  0 - 11 mg/dL   Comment:            LOWEST DETECTABLE LIMIT FOR     SERUM ALCOHOL IS 11 mg/dL     FOR MEDICAL PURPOSES ONLY  URINALYSIS, ROUTINE W REFLEX MICROSCOPIC     Status: Abnormal   Collection Time    11/24/12  2:51 PM      Result Value Range    Color, Urine YELLOW  YELLOW   APPearance CLOUDY (*) CLEAR   Specific Gravity, Urine 1.018  1.005 - 1.030   pH 7.5  5.0 - 8.0   Glucose, UA NEGATIVE  NEGATIVE mg/dL   Hgb urine dipstick NEGATIVE  NEGATIVE   Bilirubin Urine NEGATIVE  NEGATIVE   Ketones, ur NEGATIVE  NEGATIVE mg/dL   Protein, ur NEGATIVE  NEGATIVE mg/dL   Urobilinogen, UA 0.2  0.0 - 1.0 mg/dL   Nitrite NEGATIVE  NEGATIVE   Leukocytes, UA NEGATIVE  NEGATIVE   Comment: MICROSCOPIC NOT DONE ON URINES WITH NEGATIVE PROTEIN, BLOOD, LEUKOCYTES, NITRITE, OR GLUCOSE <1000 mg/dL.   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 Psoriasis     right foot  . Asthma     triggered by cold weather; prn inhaler  . Anxiety     no current med.  . Fracture of metacarpal 02/13/2012    right small  . Depression    AXIS IV:  economic problems, housing problems, occupational problems, other psychosocial or environmental problems, problems related to social environment and problems with primary support group AXIS V:  41-50 serious symptoms  Treatment Plan/Recommendations:  Admit for crisis stabilization and safety monitoring  Treatment Plan Summary: Daily contact with patient to assess and evaluate symptoms and progress in treatment Medication management Current Medications:  Current Facility-Administered Medications  Medication Dose Route Frequency Provider Last Rate Last Dose  . acetaminophen (TYLENOL) tablet 650 mg   650 mg Oral Q6H PRN Evanna Cori Burkett, NP      . albuterol (PROVENTIL HFA;VENTOLIN HFA) 108 (90 BASE) MCG/ACT inhaler 2 puff  2 puff Inhalation Q4H PRN Audrea Muscat, NP   2 puff at 11/25/12 0829  . alum & mag hydroxide-simeth (MAALOX/MYLANTA) 200-200-20 MG/5ML suspension 30 mL  30 mL Oral Q4H PRN Evanna [name], NP      . FLUoxetine (PROZAC) capsule 10 mg  10 mg Oral Daily Nehemiah Settle, MD      . magnesium hydroxide (MILK OF MAGNESIA) suspension 30 mL  30 mL Oral Daily PRN Evanna [name], NP      . traZODone (DESYREL) tablet 50 mg  50 mg Oral QHS PRN,MR X 1 Evanna Cori Merry Proud, NP   50 mg at 11/24/12 2305    Observation Level/Precautions:  15 minute checks  Laboratory:  Reviewed admission labs  Psychotherapy:  Group and milieu therapy  Medications:  Prozac and trazodone  Consultations:  none  Discharge Concerns:  Safety   Estimated LOS: 4-7 days  Other:     I certify that inpatient services furnished can reasonably be expected to improve the patient's condition.   Kosisochukwu Burningham,JANARDHAHA R. 9/25/20141:49 PM

## 2012-11-25 NOTE — [facility] Counselor (Signed)
Adult Psychosocial Assessment Update Interdisciplinary Team  [facility] admissions/discharges:  Admissions Discharges  Date: 09/17/12 Date: 09/21/12  Date: Date:  Date: Date:  Date: Date:  Date: Date:   Changes since the last Psychosocial Assessment (including adherence to outpatient mental health and/or substance abuse treatment, situational issues contributing to decompensation and/or relapse). Pt reports coming to the hospital for depression and SI, which he reports he's had for the past few months.  Pt states that he is a "big bag of emotions".  Pt reports current stressors being the loss of his job, loss of his home, and loss of his kids - stating that they had to stay with family until he is more stable and able to care for them again.  Pt was scheduled to follow up with [facility] on last admission but reports not doing so due to getting arrested and no income to pay for the copays.               Discharge Plan 1. Will you be returning to the same living situation after discharge?   Yes: X - Returning home No:      If no, what is your plan?           2. Would you like a referral for services when you are discharged? Yes: X    If yes, for what services? Outpatient Services for medication management and therapy.    No:              Summary and Recommendations (to be completed by the evaluator) Patient is a 22 year old Caucasian Male with a diagnosis of Anxiety Disorder NOS and Major Depression, Recurrent severe.  Patient lives in [place] with friends.  Patient will benefit from crisis stabilization, medication evaluation, group therapy and psycho education in addition to case management for discharge planning.                         Signature:  Carmina Miller, 11/25/2012 10:20 AM

## 2012-11-25 NOTE — Progress Notes (Signed)
D: Patient's affect/mood is flat and depressed. Patient complained of shortness of breath earlier this morning. He reported on the self inventory sheet that his sleep is poor, appetite and ability to pay attention are good and energy level is low. Patient rated depression "7" and feelings of hopelessness "9". He's not attending groups. Writer has observed patient lying in bed all day. No scheduled medications at this time.  A: Support and encouragement provided to patient. PRN Albuterol given for shortness of breath. Maintain Q15 minute checks for safety.  R: Patient receptive. Passive SI, but contracts for safety. Denies HI and AVH. Patient remains safe.

## 2012-11-25 NOTE — [facility] Suicide Risk Assessment (Signed)
Suicide Risk Assessment  Admission Assessment     Nursing information obtained from:  Patient Demographic factors:  Male;Caucasian;Unemployed Current Mental Status:  Suicidal ideation indicated by patient Loss Factors:  Decrease in vocational status Historical Factors:  Family history of mental illness or substance abuse Risk Reduction Factors:  Responsible for children under 24 years of age;Sense of responsibility to family  CLINICAL FACTORS:   Severe Anxiety and/or Agitation Depression:   Anhedonia Comorbid alcohol abuse/dependence Hopelessness Impulsivity Insomnia Recent sense of peace/wellbeing Severe Alcohol/Substance Abuse/Dependencies Unstable or Poor Therapeutic Relationship Previous Psychiatric Diagnoses and Treatments  COGNITIVE FEATURES THAT CONTRIBUTE TO RISK:  Closed-mindedness Loss of executive function Polarized thinking Thought constriction (tunnel vision)    SUICIDE RISK:   Moderate:  Frequent suicidal ideation with limited intensity, and duration, some specificity in terms of plans, no associated intent, good self-control, limited dysphoria/symptomatology, some risk factors present, and identifiable protective factors, including available and accessible social support.  PLAN OF CARE: Admitted voluntarily emergently from [facility] for depression, anxiety and suicidal ideation with plan.   I certify that inpatient services furnished can reasonably be expected to improve the patient's condition.  Azile Minardi,JANARDHAHA R. 11/25/2012, 1:47 PM

## 2012-11-25 NOTE — [facility] Group Notes (Signed)
[facility] LCSW Group Therapy  11/25/2012  1:15 PM   Type of Therapy:  Group Therapy  Participation Level:  Did Not Attend  Marnee Sherrard Horton, LCSWA 11/25/2012 2:20 PM   

## 2012-11-25 NOTE — Progress Notes (Signed)
Adult Psychoeducational Group Note  Date:  11/25/2012 Time:  9:00 AM  Group Topic/Focus:  Morning Wellness  Participation Level:  Did Not Attend   Additional Comments:  Pt. in the room during group.  Harold Barban E 11/25/2012, 11:37 AM

## 2012-11-25 NOTE — Progress Notes (Signed)
Patient ID: [name], male   DOB: Jan 27, 1991, 22 y.o.   MRN: [id number] D: pt. Visible on the unit in dayroom interacting and playing cards. Reports day has been "horrible, but getting better", rates overall depression today at "8" of 10.  Pt reports thoughts of suicide "off and on", but contracts for safety. A: Writer encouraged pt. To focus on the positive and let staff know if he cannot contract for safety. Staff will monitor q82min for safety. Insurance underwriter. R: Pt. Is safe on the unit and attends karaoke.

## 2012-11-26 LAB — RAPID URINE DRUG SCREEN, HOSP PERFORMED
Benzodiazepines: NOT DETECTED
Cocaine: NOT DETECTED
Opiates: NOT DETECTED
Tetrahydrocannabinol: POSITIVE — AB

## 2012-11-26 NOTE — Progress Notes (Signed)
D: Patient appropriate and cooperative with staff and peers. Patient's affect/mood is depressed; brighter today as opposed to previous shift. He reported on the self inventory sheet that his sleep is fair, appetite and ability to pay attention are good and energy level is normal. Patient rated depression "4" and feelings of hopelessness "5". Tolerating medications well and attending groups.  A: Support and encouragement provided to patient. Administered scheduled medications per ordering MD. Monitor Q15 minute checks for safety.  R: Patient receptive. Passive SI, but contracts for safety. Denies HI. Patient remains safe on the unit.

## 2012-11-26 NOTE — [facility] Group Notes (Signed)
[facility] LCSW Aftercare Discharge Planning Group Note   11/26/2012 10:51 AM    Participation Quality:  Appropraite  Mood/Affect:  Appropriate, Depressed  Depression Rating:  5  Anxiety Rating:  3  Thoughts of Suicide:  No  Will you contract for safety?   NA  Current AVH:  No  Plan for Discharge/Comments:  Patient attending discharge planning group and actively participated in group.  He advised of having a home but no outpatient services.  Patient will be referred to [facility].  CSW provided all participants with daily workbook.    Transportation Means: Patient has transportation.   Supports:  Patient has a support system.   Shellye Zandi, Joesph July

## 2012-11-26 NOTE — [facility] Group Notes (Addendum)
[facility] LCSW Group Therapy  Feelings Around Relapse 1:15 -2:30        11/26/2012  2:32 PM   Type of Therapy:  Group Therapy  Participation Level:  Appropriate  Participation Quality:  Appropriate  Affect:  Appropriate  Cognitive:  Attentive Appropriate  Insight:  Developing/Improving  Engagement in Therapy: Developing/Improving  Modes of Intervention:  Discussion Exploration Problem-Solving Supportive  Summary of Progress/Problems:  The topic for today was feelings around relapse.    Patient processed feelings toward relapse and was able to relate to peers. Patient shared when he is isolating, he knows he is heading for relapse. Patient shared depression can be like drowning; you have to move to save your life.  Patient identified coping skills that can be used to prevent a relapse.   Wynn Banker 11/26/2012  2:32 PM

## 2012-11-26 NOTE — Progress Notes (Signed)
[facility] Group Notes:  (Nursing/MHT/Case Management/Adjunct)  Date:  11/26/2012  Time:  2000  Type of Therapy:  Psychoeducational Skills  Participation Level:  Active  Participation Quality:  Attentive  Affect:  Appropriate  Cognitive:  Appropriate  Insight:  Improving  Engagement in Group:  Engaged  Modes of Intervention:  Education  Summary of Progress/Problems: The patient expressed in group that he had a good day. He shared with the group that he didn't have any suicidal thoughts. In addition, he shared that his peers helped him to get through the day. His goal for tomorrow is to come up with a plan for his housing and to figure out where he will be going for follow-up care.   Hazle Coca S 11/26/2012, 11:38 PM

## 2012-11-26 NOTE — Progress Notes (Signed)
[facility] MD Progress Note  11/26/2012 2:58 PM [name]  MRN:  [id number]  Subjective: Patient states he is not having constant thoughts of wanting to harm himself. He states he just can't do that to his daughters. He has always wanted to be an EMT. He is every interested in this and a series of events has caused him to default on his school loans. He is currently living with a friend and hopes to return there upon his discharge. He states he is a little nauseated on the Buspar.  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  Psoriasis      right foot   .  Asthma      triggered by cold weather; prn inhaler   .  Anxiety      no current med.   .  Fracture of metacarpal  02/13/2012     right small   .  Depression     AXIS IV: economic problems, housing problems, occupational problems, other psychosocial or environmental problems, problems related to social environment and problems with primary support group  AXIS V: 41-50 serious symptoms  ADL's:  Intact  Sleep: poor  Appetite:  Poor  Suicidal Ideation:  decreasing Homicidal Ideation:  none AEB (as evidenced by):  Psychiatric Specialty Exam: Review of Systems  Constitutional: Negative.  Negative for fever, chills, weight loss, malaise/fatigue and diaphoresis.  HENT: Negative for congestion and sore throat.   Eyes: Negative for blurred vision, double vision and photophobia.  Respiratory: Negative for cough, shortness of breath and wheezing.    Cardiovascular: Negative for chest pain, palpitations and PND.  Gastrointestinal: Negative for heartburn, nausea, vomiting, abdominal pain, diarrhea and constipation.  Musculoskeletal: Negative for myalgias, joint pain and falls.  Neurological: Negative for dizziness, tingling, tremors, sensory change, speech change, focal weakness, seizures, loss of consciousness, weakness and headaches.  Endo/Heme/Allergies: Negative for polydipsia. Does not bruise/bleed easily.  Psychiatric/Behavioral: Negative for depression, suicidal ideas, hallucinations, memory loss and substance abuse. The patient is not nervous/anxious and does not have insomnia.     Blood pressure 129/83, pulse 90, temperature 97.5 F (36.4 C), temperature source Oral, resp. rate 16, height 5\' 5"  (1.651 m), weight 59.421 kg (131 lb).Body mass index is 21.8 kg/(m^2).  General Appearance: Casual  Eye Contact::  Fair  Speech:  Clear and Coherent  Volume:  Normal  Mood:  Anxious and Depressed  Affect:  Congruent  Thought Process:  Goal Directed  Orientation:  Full (Time, Place, and Person)  Thought Content:  WDL  Suicidal Thoughts:  Yes.  without intent/plan  Homicidal Thoughts:  No  Memory:  Immediate;   Fair  Judgement:  Impaired  Insight:  Shallow  Psychomotor Activity:  Normal  Concentration:  Fair  Recall:  Fair  Akathisia:  Yes  Handed:  Right  AIMS (if indicated):     Assets:  Communication Skills Desire for Improvement Physical Health  Sleep:  Number of Hours: 6.5   Current Medications: Current Facility-Administered Medications  Medication Dose Route Frequency Provider Last Rate Last Dose  . acetaminophen (TYLENOL)  tablet 650 mg  650 mg Oral Q6H PRN Evanna Cori Burkett, NP      . albuterol (PROVENTIL HFA;VENTOLIN HFA) 108 (90 BASE) MCG/ACT inhaler 2 puff  2 puff Inhalation Q4H PRN Audrea Muscat, NP   2 puff at 11/26/12 0804  . alum & mag hydroxide-simeth (MAALOX/MYLANTA) 200-200-20 MG/5ML suspension 30 mL   30 mL Oral Q4H PRN Evanna [name], NP      . busPIRone (BUSPAR) tablet 7.5 mg  7.5 mg Oral BID Nehemiah Settle, MD   7.5 mg at 11/26/12 0804  . FLUoxetine (PROZAC) capsule 10 mg  10 mg Oral Daily Nehemiah Settle, MD   10 mg at 11/26/12 0804  . magnesium hydroxide (MILK OF MAGNESIA) suspension 30 mL  30 mL Oral Daily PRN Evanna Cori Burkett, NP      . nicotine (NICODERM CQ - dosed in mg/24 hours) patch 21 mg  21 mg Transdermal Daily Nehemiah Settle, MD   21 mg at 11/25/12 1557  . traZODone (DESYREL) tablet 50 mg  50 mg Oral QHS PRN,MR X 1 Evanna Cori Merry Proud, NP   50 mg at 11/25/12 2314    Lab Results: No results found for this or any previous visit (from the past 48 hour(s)).  Physical Findings: AIMS: Facial and Oral Movements Muscles of Facial Expression: None, normal Lips and Perioral Area: None, normal Jaw: None, normal Tongue: None, normal,Extremity Movements Upper (arms, wrists, hands, fingers): None, normal Lower (legs, knees, ankles, toes): None, normal, Trunk Movements Neck, shoulders, hips: None, normal, Overall Severity Severity of abnormal movements (highest score from questions above): None, normal Incapacitation due to abnormal movements: None, normal Patient's awareness of abnormal movements (rate only patient's report): No Awareness, Dental Status Current problems with teeth and/or dentures?: No Does patient usually wear dentures?: No  CIWA:    COWS:     Treatment Plan Summary: Daily contact with patient to assess and evaluate symptoms and progress in treatment Medication management  Plan: 1. Continue crisis management and stabilization. 2. Medication management to reduce current symptoms to base line and improve patient's overall level of functioning; continue Prozac for depression and Buspirone for anxiety 3. Treat health problems as indicated. 4. Develop treatment plan to decrease risk of relapse upon discharge and the need for  readmission. 5. Psycho-social education regarding relapse prevention and self care. 6. Health care follow up as needed for medical problems. 7. Continue home medications where appropriate. 8. Anticipate D/C 2-3 days if he can continue to contract for safety and improve his symptoms.  Medical Decision Making Problem Points:  Established problem, stable/improving (1) Data Points:  Review or order medicine tests (1)  I certify that inpatient services furnished can reasonably be expected to improve the patient's condition.  Rona Ravens. [place] RPAC 3:46 PM 11/26/2012  Reviewed the information documented and agree with the treatment plan.  Chanele Douglas,JANARDHAHA R. 11/26/2012 10:06 PM

## 2012-11-26 NOTE — Progress Notes (Signed)
Adult Psychoeducational Group Note  Date:  11/26/2012 Time:  10:00 11:00am Group Topic/Focus:  Relapse Prevention Planning:   The focus of this group is to define relapse and discuss the need for planning to combat relapse.  Participation Level:  Active  Participation Quality:  Appropriate and Attentive  Affect:  Appropriate  Cognitive:  Alert and Appropriate  Insight: Appropriate  Engagement in Group:  Engaged  Modes of Intervention:  Discussion and Education  Additional Comments:  Pt attended and participate in group. When ask how he could prevent a relasp pt stated staying focused, find another job and a place to live.  Shelly Bombard D 11/26/2012, 11:28 AM

## 2012-11-26 NOTE — Tx Team (Signed)
Interdisciplinary Treatment Plan Update   Date Reviewed:  11/26/2012  Time Reviewed:  9:41 AM  Progress in Treatment:   Attending groups: Yes Participating in groups: Yes Taking medication as prescribed: Yes  Tolerating medication: Yes Family/Significant other contact made: No, but will ask patient for consent for collateral contact Patient understands diagnosis: Yes  Discussing patient identified problems/goals with staff: Yes Medical problems stabilized or resolved: Yes Denies suicidal/homicidal ideation: Yes. Patient has not harmed self or others: Yes  For review of initial/current patient goals, please see plan of care.  Estimated Length of Stay:  3-5 days  Reasons for Continued Hospitalization:  Anxiety Depression Medication stabilization   New Problems/Goals identified:    Discharge Plan or Barriers:   Home with outpatient follow up to be determined.  Additional Comments:  N/A  Attendees:  Patient:  11/26/2012 9:41 AM   Signature: Mervyn Gay, MD 11/26/2012 9:41 AM  Signature:  Verne Spurr, PA 11/26/2012 9:41 AM  Signature: Harold Barban, RN 11/26/2012 9:41 AM  Signature: Elliot Cousin, RN 11/26/2012 9:41 AM  Signature:   11/26/2012 9:41 AM  Signature:  Juline Patch, LCSW 11/26/2012 9:41 AM  Signature:  Reyes Ivan, LCSW 11/26/2012 9:41 AM  Signature:  Sharin Grave Coordinator 11/26/2012 9:41 AM  Signature:   11/26/2012 9:41 AM  Signature: 11/26/2012  9:41 AM  Signature:    Signature:      Scribe for Treatment Team:   Juline Patch,  11/26/2012 9:41 AM

## 2012-11-27 NOTE — Progress Notes (Signed)
Patient ID: [name], male   DOB: [birth year], 22 y.o.   MRN: [id number] D: Pt is awake and active on the unit this AM. Pt denies SI/HI and A/V hallucinations. Pt rates their depression at 3 and hopelessness at 2. Pt's mood is depressed/withdrawn and his affect is flat. Pt forwards little to staff but is participating in the milieu, although he was distracted and uninterested during group.   A: Encouraged pt to discuss feelings with staff and administered medication per MD orders. Writer also encouraged pt to participate in groups.  R: Pt is attending groups and tolerating medications well. Writer will continue to monitor. 15 minute checks are ongoing for safety.

## 2012-11-27 NOTE — Progress Notes (Signed)
Patient ID: [name], male   DOB: Nov 08, 1990, 22 y.o.   MRN: [id number] D: Patient in dayroom on approach. Pt appeared depressed and anxious.  Pt rated depression and anxiety as 4 on a 0-10 scale. Pt affect is a lot brighter than from previous day.  Pt denies SI/HI/AVH and pain. Pt attended evening wrap up group and engaged in discussion. Pt is observed in dayroom talking and interacting appropriately with peers. Pt denies any other needs or concerns.  Cooperative with assessment. No acute distressed noted at this time.   A: Met with pt 1:1. Medications administered as prescribed. Writer encouraged pt to discuss feelings. Pt encouraged to come to staff with any question or concerns.   R: Patient remains safe. He is complaint with medications and denies any adverse reaction. Continue current POC.

## 2012-11-27 NOTE — Progress Notes (Signed)
Patient ID: [name], male   DOB: [birth year], 22 y.o.   MRN: [id number]  Patient signed a 72 hour request for discharge.

## 2012-11-27 NOTE — [facility] Group Notes (Signed)
[facility] Group Notes:  (Clinical Social Work)  11/27/2012   3:00-4:00PM  Summary of Progress/Problems:   The main focus of today's process group was for the patient to identify ways in which they have sabotaged their own mental health wellness/recovery.  Motivational interviewing was used to explore the reasons they engage in this behavior, and reasons they may have for wanting to change.  The Stages of Change were explained to the group using a handout. The patient expressed that he self sabotages by avoiding stressful situations, sometimes with negative consequences.  He just lost a job for walking out because it was stressful.  He wants to change for his children, and to get into school in order to have a career, not just a job.  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  Type of Therapy:  Process Group  Participation Level:  Active  Participation Quality:  Attentive and Sharing  Affect:  Anxious and Blunted  Cognitive:  Oriented  Insight:  Developing/Improving  Engagement in Therapy:  Engaged  Modes of Intervention:  Education, Motivational Interviewing   Ambrose Mantle, LCSW 11/27/2012, 4:34 PM

## 2012-11-27 NOTE — Progress Notes (Signed)
[facility] Group Notes:  (Nursing/MHT/Case Management/Adjunct)  Date:  11/27/2012  Time:  2000  Type of Therapy:  Psychoeducational Skills  Participation Level:  Active  Participation Quality:  Appropriate  Affect:  Appropriate  Cognitive:  Appropriate  Insight:  Good  Engagement in Group:  Engaged  Modes of Intervention:  Education  Summary of Progress/Problems: The patient suggested that he had a good day. He states that he intends to return to school and has plans to live with friends following discharge. His goal for tomorrow is to practice some of the coping skills that he learned in group.   Hazle Coca S 11/27/2012, 9:38 PM

## 2012-11-27 NOTE — Progress Notes (Signed)
Adult Psychoeducational Group Note  Date:  11/27/2012 Time:  0900  Group Topic/Focus:  Healthy Coping Skills  Participation Level:  Minimal  Participation Quality:  Drowsy  Affect:  Depressed and Flat  Cognitive:  Appropriate  Insight: Lacking  Engagement in Group:  Limited  Modes of Intervention:  Education and Exploration  Additional Comments:  Pt seemed preoccupied during group, inattentive.  Radin Raptis Shari Prows 11/27/2012, 10:22 AM

## 2012-11-28 MED ORDER — BUSPIRONE HCL 10 MG PO TABS
10.0000 mg | ORAL_TABLET | Freq: Two times a day (BID) | ORAL | Status: DC
  Administered 2012-11-29: 10 mg via ORAL
  Filled 2012-11-28: qty 28
  Filled 2012-11-28 (×2): qty 1
  Filled 2012-11-28: qty 2
  Filled 2012-11-28: qty 1
  Filled 2012-11-28: qty 28

## 2012-11-28 NOTE — Progress Notes (Signed)
Patient ID: [name], male   DOB: 01-11-1991, 22 y.o.   MRN: [id number] D: The patient is resting in bed with eyes closed. A: No distress noted.  Safety maintained R. Pt. is safe. Will continue to monitor.

## 2012-11-28 NOTE — Progress Notes (Addendum)
Pt appears in good spirits and did go outdoors with the other pts. He has been participating in groups and does stay in the dayroom with the other pts. Pt stated he has had a good day today. No SI or HI and does contract for safety. Pt requested his inhaler stating he felt a little short of breath. Pt does not appear in respiratory distress. Pt states he wants to go to [employer] and become a paramedic. He also admits he has two small children and one lives with his mom and her BF. Pt would like to move back with mom but BF will not allow it. Pt presently does live with a friend. He hopes when he gets out to get a job and get his car insurance back. Pt stated he suffers from muliple anxiety attacks al the time.

## 2012-11-28 NOTE — Progress Notes (Addendum)
Patient ID: [name], male   DOB: [birth year], 22 y.o.   MRN: [id number] [facility] MD Progress Note  11/28/2012 [name]  MRN:  [id number]  Subjective: The patient reports continued panic attacks ("my chest hurts, my body tingles, I feel like I'm going to vomit--and it lasts for 30 seconds.)  The patient reports that he had 3 panic attacks today.  He denies any thoughts of hurting himself or others. He feels he is could do with an increase of medications. He reports that the he is taking his medications and denies any side effects.     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  Psoriasis      right foot   .  Asthma      triggered by cold weather; prn inhaler   .  Anxiety      no current med.   .  Fracture of metacarpal  02/13/2012     right small   .  Depression     AXIS IV: economic problems, housing problems, occupational problems, other psychosocial or environmental problems, problems related to social environment and problems with primary support group  AXIS V: 41-50 serious symptoms    ADL's:  Intact  Sleep: poor  Appetite:  Good  Suicidal Ideation:  decreasing Homicidal Ideation:  none AEB (as evidenced by):  Psychiatric Specialty Exam: Review of Systems  Constitutional: Negative.  Negative for fever, chills, weight loss, malaise/fatigue and diaphoresis.  HENT: Negative for congestion and sore throat.   Eyes: Negative for blurred vision, double vision and photophobia.   Respiratory: Negative for cough, shortness of breath and wheezing.   Cardiovascular: Negative for chest pain, palpitations and PND.  Gastrointestinal: Negative for heartburn, nausea, vomiting, abdominal pain, diarrhea and constipation.  Musculoskeletal: Negative for myalgias, joint pain and falls.  Neurological: Negative for dizziness, tingling, tremors, sensory change, speech change, focal weakness, seizures, loss of consciousness, weakness and headaches.  Endo/Heme/Allergies: Negative for polydipsia. Does not bruise/bleed easily.  Psychiatric/Behavioral: Negative for depression, suicidal ideas, hallucinations, memory loss and substance abuse. The patient is not nervous/anxious and does not have insomnia.     Blood pressure 120/87, pulse 76, temperature 97.8 F (36.6 C), temperature source Oral, resp. rate 18, height 5\' 5"  (1.651 m), weight 59.421 kg (131 lb).Body mass index is 21.8 kg/(m^2).  General Appearance: Casual  Eye Contact::  Fair  Speech:  Clear and Coherent  Volume:  Normal  Mood:  Anxious  Affect:  Congruent  Thought Process:  Goal Directed  Orientation:  Full (Time, Place, and Person)  Thought Content:  WDL  Suicidal Thoughts:  Yes.  without intent/plan  Homicidal Thoughts:  No  Memory:  Immediate;   Good Recent;   Good  Judgement:  Impaired  Insight:  Shallow  Psychomotor Activity:  Normal  Concentration:  Fair  Recall:  Fair  Akathisia:  Yes  Handed:  Right  AIMS (if indicated):     Assets:  Communication Skills Desire for Improvement Physical Health  Sleep:  Number of Hours: 6   Current Medications: Current Facility-Administered  Medications  Medication Dose Route Frequency Provider Last Rate Last Dose  . acetaminophen (TYLENOL) tablet 650 mg  650 mg Oral Q6H PRN Evanna Cori Burkett, NP      . albuterol (PROVENTIL HFA;VENTOLIN HFA) 108 (90 BASE) MCG/ACT inhaler 2 puff  2 puff Inhalation Q4H PRN Audrea Muscat, NP   2 puff at 11/26/12 0804  . alum & mag  hydroxide-simeth (MAALOX/MYLANTA) 200-200-20 MG/5ML suspension 30 mL  30 mL Oral Q4H PRN Evanna [name], NP      . busPIRone (BUSPAR) tablet 7.5 mg  7.5 mg Oral BID Nehemiah Settle, MD   7.5 mg at 11/26/12 0804  . FLUoxetine (PROZAC) capsule 10 mg  10 mg Oral Daily Nehemiah Settle, MD   10 mg at 11/26/12 0804  . magnesium hydroxide (MILK OF MAGNESIA) suspension 30 mL  30 mL Oral Daily PRN Evanna Cori Burkett, NP      . nicotine (NICODERM CQ - dosed in mg/24 hours) patch 21 mg  21 mg Transdermal Daily Nehemiah Settle, MD   21 mg at 11/25/12 1557  . traZODone (DESYREL) tablet 50 mg  50 mg Oral QHS PRN,MR X 1 Evanna Cori Merry Proud, NP   50 mg at 11/25/12 2314    Lab Results:  No results found for this or any previous visit (from the past 48 hour(s)).  Physical Findings: AIMS: Facial and Oral Movements Muscles of Facial Expression: None, normal Lips and Perioral Area: None, normal Jaw: None, normal Tongue: None, normal,Extremity Movements Upper (arms, wrists, hands, fingers): None, normal Lower (legs, knees, ankles, toes): None, normal, Trunk Movements Neck, shoulders, hips: None, normal, Overall Severity Severity of abnormal movements (highest score from questions above): None, normal Incapacitation due to abnormal movements: None, normal Patient's awareness of abnormal movements (rate only patient's report): No Awareness, Dental Status Current problems with teeth and/or dentures?: No Does patient usually wear dentures?: No  CIWA:    COWS:     Treatment Plan Summary: Daily contact with patient to assess and evaluate symptoms and progress in treatment Medication management  Plan: 1. Continue crisis management and stabilization. 2. Medication management to reduce current symptoms to base line and improve patient's overall level of functioning; continue Prozac for depression and Buspirone for anxiety. Will increase buspirone to 10 mg TID. 3. Treat health  problems as indicated. 4. Develop treatment plan to decrease risk of relapse upon discharge and the need for readmission. 5. Psycho-social education regarding relapse prevention and self care. 6. Health care follow up as needed for medical problems. 7. Continue home medications where appropriate. Medical Decision Making Problem Points:  Established problem, stable/improving (1) Data Points:  Review or order medicine tests (1)  I certify that inpatient services furnished can reasonably be expected to improve the patient's condition.   Moneisha Vosler, [facility] 11/28/2012 5:29 PM

## 2012-11-28 NOTE — Progress Notes (Signed)
Patient ID: [name], male   DOB: [birth year], 22 y.o.   MRN: [id number] D: Pt is awake and active on the unit this AM. Pt denies SI/HI and A/V hallucinations. Pt rates their depression at 1 and hopelessness at 1. Pt's mood is depressed and his affect is appropriate. Pt did not attend group this AM. 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   A: Encouraged pt to discuss feelings with staff and administered medication per MD orders. Writer also encouraged pt to participate in groups.  R: Pt is attending groups and tolerating medications well. Writer will continue to monitor. 15 minute checks are ongoing for safety.

## 2012-11-28 NOTE — [facility] Group Notes (Signed)
[facility] Group Notes:  (Nursing/MHT/Case Management/Adjunct)  Date:  11/28/2012  Time:  10:59 AM   Type of Therapy: Psychoeducational Skills  Participation Level: Did Not Attend  Participation Quality: NA  Affect: NA  Cognitive: NA  Insight: None  Engagement in Group: NA  Modes of Intervention: NA  Summary of Progress/Problems:   Summary of Progress/Problems:  Malva Limes 11/28/2012, 10:59 AM

## 2012-11-28 NOTE — Progress Notes (Signed)
Patient ID: [name], male   DOB: [birth year], 22 y.o.   MRN: [id number] [facility] MD Progress Note  11/27/2012 [name]  MRN:  [id number]  Subjective: Patient states "I had a moment today. I had gone back to sleep after breakfast and when I woke up I was irritated and angry for no reason, I just wanted to punch something." Objective: Patient is showing good eye contact, speech is clear, mood is calm, affect is congruent. Praised him for showing restraint and using introspection to evaluate his feelings this morning. 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  Psoriasis      right foot   .  Asthma      triggered by cold weather; prn inhaler   .  Anxiety      no current med.   .  Fracture of metacarpal  02/13/2012     right small   .  Depression     AXIS IV: economic problems, housing problems, occupational problems, other psychosocial or environmental problems, problems related to social environment and problems with primary support group  AXIS V: 41-50 serious symptoms  ADL's:  Intact  Sleep: poor  Appetite:  Poor  Suicidal Ideation:  decreasing Homicidal Ideation:  none AEB (as evidenced by):  Psychiatric Specialty Exam: Review of Systems  Constitutional: Negative.  Negative for fever, chills, weight loss, malaise/fatigue and diaphoresis.  HENT: Negative for congestion and sore throat.   Eyes: Negative for blurred vision, double vision  and photophobia.  Respiratory: Negative for cough, shortness of breath and wheezing.   Cardiovascular: Negative for chest pain, palpitations and PND.  Gastrointestinal: Negative for heartburn, nausea, vomiting, abdominal pain, diarrhea and constipation.  Musculoskeletal: Negative for myalgias, joint pain and falls.  Neurological: Negative for dizziness, tingling, tremors, sensory change, speech change, focal weakness, seizures, loss of consciousness, weakness and headaches.  Endo/Heme/Allergies: Negative for polydipsia. Does not bruise/bleed easily.  Psychiatric/Behavioral: Negative for depression, suicidal ideas, hallucinations, memory loss and substance abuse. The patient is not nervous/anxious and does not have insomnia.     Blood pressure 109/71, pulse 89, temperature 97.3 F (36.3 C), temperature source Oral, resp. rate 18, height 5\' 5"  (1.651 m), weight 59.421 kg (131 lb).Body mass index is 21.8 kg/(m^2).  General Appearance: Casual  Eye Contact::  Fair  Speech:  Clear and Coherent  Volume:  Normal  Mood:  Anxious and Depressed  Affect:  Congruent  Thought Process:  Goal Directed  Orientation:  Full (Time, Place, and Person)  Thought Content:  WDL  Suicidal Thoughts:  Yes.  without intent/plan  Homicidal Thoughts:  No  Memory:  Immediate;   Fair  Judgement:  Impaired  Insight:  Shallow  Psychomotor Activity:  Normal  Concentration:  Fair  Recall:  Fair  Akathisia:  Yes  Handed:  Right  AIMS (if indicated):     Assets:  Communication Skills Desire for Improvement Physical Health  Sleep:  Number of Hours: 6.25   Current Medications: Current Facility-Administered Medications  Medication Dose Route Frequency  Provider Last Rate Last Dose  . acetaminophen (TYLENOL) tablet 650 mg  650 mg Oral Q6H PRN Evanna Cori Burkett, NP      . albuterol (PROVENTIL HFA;VENTOLIN HFA) 108 (90 BASE) MCG/ACT inhaler 2 puff  2 puff Inhalation Q4H PRN Audrea Muscat, NP   2 puff at 11/26/12  0804  . alum & mag hydroxide-simeth (MAALOX/MYLANTA) 200-200-20 MG/5ML suspension 30 mL  30 mL Oral Q4H PRN Evanna [name], NP      . busPIRone (BUSPAR) tablet 7.5 mg  7.5 mg Oral BID Nehemiah Settle, MD   7.5 mg at 11/26/12 0804  . FLUoxetine (PROZAC) capsule 10 mg  10 mg Oral Daily Nehemiah Settle, MD   10 mg at 11/26/12 0804  . magnesium hydroxide (MILK OF MAGNESIA) suspension 30 mL  30 mL Oral Daily PRN Evanna Cori Burkett, NP      . nicotine (NICODERM CQ - dosed in mg/24 hours) patch 21 mg  21 mg Transdermal Daily Nehemiah Settle, MD   21 mg at 11/25/12 1557  . traZODone (DESYREL) tablet 50 mg  50 mg Oral QHS PRN,MR X 1 Evanna Cori Merry Proud, NP   50 mg at 11/25/12 2314    Lab Results:  Results for orders placed during the hospital encounter of 11/24/12 (from the past 48 hour(s))  URINE RAPID DRUG SCREEN (HOSP PERFORMED)     Status: Abnormal   Collection Time    11/26/12  4:18 PM      Result Value Range   Opiates NONE DETECTED  NONE DETECTED   Cocaine NONE DETECTED  NONE DETECTED   Benzodiazepines NONE DETECTED  NONE DETECTED   Amphetamines NONE DETECTED  NONE DETECTED   Tetrahydrocannabinol POSITIVE (*) NONE DETECTED   Barbiturates NONE DETECTED  NONE DETECTED   Comment:            DRUG SCREEN FOR MEDICAL PURPOSES     ONLY.  IF CONFIRMATION IS NEEDED     FOR ANY PURPOSE, NOTIFY LAB     WITHIN 5 DAYS.                LOWEST DETECTABLE LIMITS     FOR URINE DRUG SCREEN     Drug Class       Cutoff (ng/mL)     Amphetamine      1000     Barbiturate      200     Benzodiazepine   200     Tricyclics       300     Opiates          300     Cocaine          300     THC              50     Performed at [facility]    Physical Findings: AIMS: Facial and Oral Movements Muscles of Facial Expression: None, normal Lips and Perioral Area: None, normal Jaw: None, normal Tongue: None, normal,Extremity Movements Upper (arms, wrists,  hands, fingers): None, normal Lower (legs, knees, ankles, toes): None, normal, Trunk Movements Neck, shoulders, hips: None, normal, Overall Severity Severity of abnormal movements (highest score from questions above): None, normal Incapacitation due to abnormal movements: None, normal Patient's awareness of abnormal movements (rate only patient's report): No Awareness, Dental Status Current problems with teeth and/or dentures?: No Does patient usually wear dentures?: No  CIWA:    COWS:  Treatment Plan Summary: Daily contact with patient to assess and evaluate symptoms and progress in treatment Medication management  Plan: 1. Continue crisis management and stabilization. 2. Medication management to reduce current symptoms to base line and improve patient's overall level of functioning; continue Prozac for depression and Buspirone for anxiety 3. Treat health problems as indicated. 4. Develop treatment plan to decrease risk of relapse upon discharge and the need for readmission. 5. Psycho-social education regarding relapse prevention and self care. 6. Health care follow up as needed for medical problems. 7. Continue home medications where appropriate. Medical Decision Making Problem Points:  Established problem, stable/improving (1) Data Points:  Review or order medicine tests (1)  I certify that inpatient services furnished can reasonably be expected to improve the patient's condition.  Rona Ravens. Mashburn RPAC 12:49 AM 11/28/2012    MASHBURN,NEIL 11/28/2012 12:49 AM  Discussed with provider. Reviewed note. Agree with above findings, assessment and plan.  Continue buspirone, fluoxetine, and trazodone. Patient was seen on 11/27/2012 by Verne Spurr, NP, with late entry note as documented above.  Jacqulyn Cane, M.D.  11/28/2012 5:57 AM

## 2012-11-28 NOTE — [facility] Group Notes (Signed)
[facility] Group Notes:  (Clinical Social Work)  11/28/2012   3:00-4:00PM  Summary of Progress/Problems:   The main focus of today's process group was to   identify the patient's current support system and decide on other supports that can be put in place.  The picture on workbook was used to discuss why additional supports are needed, and a hand-out was distributed with four definitions/levels of support, then used to talk about how patients have given and received all different kinds of support.  An emphasis was placed on using counselor, doctor, therapy groups, 12-step groups, and problem-specific support groups to expand supports.  The patient was present in group but did not participate in the discussion.  He had a panic attack during group and when asked about it, stated that he felt like his chest was caving in.  He was able to remain in the group and the panic attack passed.  He stated that he has noticed that his panic attacks come quite randomly.  Type of Therapy:  Process Group  Participation Level:  Minimal  Participation Quality:  Attentive  Affect:  Anxious and Flat  Cognitive:  Oriented  Insight:  Lacking  Engagement in Therapy:  Limited  Modes of Intervention:  Education,  Support and Processing  Ambrose Mantle, LCSW 11/28/2012, 4:43 PM

## 2012-11-28 NOTE — Progress Notes (Signed)
Patient ID: [name], male   DOB: 1990-03-05, 22 y.o.   MRN: [id number]  D: Patient pleasant and cooperative during shift. Pt states he is looking forward to moving on with his life and going back to school. 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 A: Q 15 minute safety checks, encourage group participation and staff/peer interaction. Administer medications as ordered by MD. R: Patient denies SI at this time. Pt signed a 72 hour request for discharge stating he is ready to get home and is excited about his future again. Pt engaged in his treatment and participated in group session.

## 2012-11-29 MED ORDER — BUSPIRONE HCL 10 MG PO TABS: 10.0000 mg | ORAL_TABLET | Freq: Two times a day (BID) | ORAL | Status: DC

## 2012-11-29 MED ORDER — FLUOXETINE HCL 10 MG PO CAPS: 10.0000 mg | ORAL_CAPSULE | Freq: Every day | ORAL | Status: DC

## 2012-11-29 NOTE — Progress Notes (Signed)
[facility] Group Notes:  (Nursing/MHT/Case Management/Adjunct)  Date:  11/28/2012 Time:  2000  Type of Therapy:  Psychoeducational Skills  Participation Level:  Minimal  Participation Quality:  Resistant  Affect:  Defensive  Cognitive:  Appropriate  Insight:  Good  Engagement in Group:  Lacking  Modes of Intervention:  Education  Summary of Progress/Problems: The patient shared with the group that he had a bad day. He explained that he had more panic attacks today as compared to yesterday. The panic attacks only last for about "60 seconds" according to the patient. His goal for tomorrow is to meditate as a means of coping with his panic attacks.   Dejae Bernet S 11/29/2012, 12:11 AM

## 2012-11-29 NOTE — Progress Notes (Signed)
[facility] Adult Case Management Discharge Plan :  Will you be returning to the same living situation after discharge: Yes,  Patient will resume previous living arrangement. At discharge, do you have transportation home?:Yes,  Patient to arrange transportation. Do you have the ability to pay for your medications:  No, patient will need assistance with indigent medications.  Release of information consent forms completed and in the chart;  Patient's signature needed at discharge.  Patient to Follow up at: Follow-up Information   Follow up with [facility]  On 11/30/2012. (Please go to [facility]'s walk in clinic on Tuesday, November 30, 2012 or any weekday between 8am and 12 noon for medication managtment.)    Contact information:   [address] [place], [place]   [address]  [phone]      Patient denies SI/HI:   Patient no longer endorsing SI/HI or other thoughts of self harm.   Safety Planning and Suicide Prevention discussed: .Reviewed with all patients during discharge planning group   Terryon Pineiro Hairston 11/29/2012, 10:15 AM

## 2012-11-29 NOTE — Progress Notes (Signed)
Adult Psychoeducational Group Note  Date:  11/29/2012 Time:  11:00am Group Topic/Focus:  Self Care:   The focus of this group is to help patients understand the importance of self-care in order to improve or restore emotional, physical, spiritual, interpersonal, and financial health.  Participation Level:  Active  Participation Quality:  Appropriate and Attentive  Affect:  Appropriate  Cognitive:  Alert and Appropriate  Insight: Appropriate  Engagement in Group:  Engaged  Modes of Intervention:  Discussion and Education  Additional Comments:  Pt attended and participated in group. When ask what he could do for self care after discharge pt stated to be financially stable take medicine, look for work and take phlebotomy classes or EMS training.  Shelly Bombard D 11/29/2012, 1:39 PM

## 2012-11-29 NOTE — [facility] Group Notes (Signed)
[facility] LCSW Aftercare Discharge Planning Group Note   11/29/2012 10:14 AM    Participation Quality:  Appropraite  Mood/Affect:  Appropriate  Depression Rating:  1  Anxiety Rating:  1  Thoughts of Suicide:  No  Will you contract for safety?   NA  Current AVH:  No  Plan for Discharge/Comments:  Patient attending discharge planning group and actively participated in group.  He reports being much better and hopes to discharge today.  He will follow up with [facility] for outpatient services.  CSW provided all participants with daily workbook.  Transportation Means: Patient has transportation.   Supports:  Patient has a support system.   Jef Futch, Joesph July

## 2012-11-29 NOTE — Progress Notes (Signed)
D:  Patient's self inventory sheet, patient sleeps well, has good appetite, normal energy level, good attention span.  Rated depression, hopeless, anxiety #1.  Denied withdrawals.  Denied SI.  Denied physical problems.  No problems taking meds after discharge. A:  Medications administered per MD orders.  Emotional support and encouragement given patient. R:  Denied SI and HI.  Denied A/V hallucinations.  Denied pain.  Will continue to monitor for safety with 15 minute checks.

## 2012-11-29 NOTE — Progress Notes (Signed)
Discharge Note:  Patient discharged home with friend.  Denied SI and HI.  Denied A/V hallucinations.  Denied pain.  Patient received all his belongings, clothing, miscellaneous items, toiletries, prescriptions, medications.  Patient received suicide prevention information which was given and discussed with patient who stated he understood and had no questions.  Patient stated he appreciated all assistance received from [facility] staff.

## 2012-11-29 NOTE — Discharge Summary (Signed)
Physician Discharge Summary Note  Patient:  [name] is an 22 y.o., male MRN:  [id number] DOB:  12/02/1990 Patient phone:  [phone] (home)  Patient address:   [address] 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  Psoriasis      right foot   .  Asthma      triggered by cold weather; prn inhaler   .  Anxiety      no current med.   .  Fracture of metacarpal  02/13/2012     right small   .  Depression     AXIS IV: economic problems, housing problems, occupational problems, other psychosocial or environmental problems, problems related to social environment and problems with primary support group  AXIS V: 41-50 serious symptoms   Level of Care:  OP  Hospital Course:         [name] is an 23 y.o. Male, unemployed admitted voluntarily, emergently from [facility] for depression, anxiety and suicidal ideation with plan of shooting himself.        [name] was oriented to the unit and encouraged to participate in unit programming. Medical problems were identified and treated appropriately. Home medication was restarted as needed. Psychiatric medication management was initiated.        The patient was evaluated each day by a clinical  provider to ascertain the patient's response to treatment.  Improvement was noted by the patient's report of decreasing symptoms, improved sleep and appetite, affect, medication tolerance, behavior, and participation in unit programming.  [name] was asked each day to complete a self inventory noting mood, mental status, pain, new symptoms, anxiety and concerns.         He responded well to medication and being in a therapeutic and supportive environment. Positive and appropriate behavior was noted and the patient was motivated for recovery.  [name] worked closely with the treatment team and case manager to develop a discharge plan with appropriate goals. Coping skills, problem solving as well as relaxation therapies were also part of the unit programming.         By the day of discharge [name]  was in much improved condition than upon admission.  Symptoms were reported as significantly decreased or resolved completely. The patient denied SI/HI and voiced no AVH. He was motivated to continue taking medication with a goal of continued improvement in mental health.          [name] was discharged home with a plan to follow up as noted below. Consults:  None  Significant Diagnostic Studies:  None  Discharge Vitals:   Blood pressure 116/74, pulse 88, temperature 97.4 F (36.3 C), temperature source Oral, resp. rate 16, height 5\' 5"  (1.651 m), weight 59.421 kg (131 lb). Body mass index is 21.8 kg/(m^2). Lab Results:  Results for orders placed during the hospital encounter of 11/24/12 (from the past 72 hour(s))  URINE RAPID DRUG SCREEN (HOSP PERFORMED)     Status: Abnormal   Collection Time    11/26/12  4:18 PM      Result Value Range   Opiates NONE DETECTED  NONE DETECTED   Cocaine NONE DETECTED  NONE DETECTED   Benzodiazepines NONE DETECTED  NONE DETECTED   Amphetamines NONE DETECTED  NONE DETECTED   Tetrahydrocannabinol POSITIVE (*) NONE DETECTED   Barbiturates NONE DETECTED  NONE DETECTED    Comment:            DRUG SCREEN FOR MEDICAL PURPOSES     ONLY.  IF CONFIRMATION IS NEEDED     FOR ANY PURPOSE, NOTIFY LAB     WITHIN 5 DAYS.                LOWEST DETECTABLE LIMITS     FOR URINE DRUG SCREEN     Drug Class       Cutoff (ng/mL)     Amphetamine      1000     Barbiturate      200     Benzodiazepine   200     Tricyclics       300     Opiates          300     Cocaine          300     THC              50     Performed at [facility]    Physical Findings: AIMS: Facial and Oral Movements Muscles of Facial Expression: None, normal Lips and Perioral Area: None, normal Jaw: None, normal Tongue: None, normal,Extremity Movements Upper (arms, wrists, hands, fingers): None, normal Lower (legs, knees, ankles, toes): None, normal, Trunk Movements Neck, shoulders, hips: None, normal, Overall Severity Severity of abnormal movements (highest score from questions above): None, normal Incapacitation due to abnormal movements: None, normal Patient's awareness of abnormal movements (rate only patient's report): No Awareness, Dental Status Current problems with teeth and/or dentures?: No Does patient usually wear dentures?: No  CIWA:  CIWA-Ar Total: 1 COWS:  COWS Total Score: 2  Psychiatric Specialty Exam: See Psychiatric Specialty Exam and Suicide Risk Assessment completed by Attending Physician prior to discharge.  Discharge destination:  Home  Is patient on multiple antipsychotic therapies at discharge:  No   Has Patient had three or more failed trials of antipsychotic monotherapy by history:  No  Recommended Plan for Multiple Antipsychotic Therapies: NA  Discharge Orders   Future Orders Complete By Expires   Diet - low sodium heart healthy  As directed    Discharge instructions  As directed    Comments:     Take all of your medications as directed. Be sure to keep all of your follow up appointments.  If you are unable to keep your follow up  appointment, call your Doctor's office to let them know, and reschedule.  Make sure that you have enough medication to last until your appointment. Be sure to get plenty of rest. Going to bed at the same time each night will help. Try to avoid sleeping during the day.  Increase your activity as tolerated. Regular exercise will help you to sleep better and improve your mental health. Eating a heart healthy diet is recommended. Try to avoid salty or fried foods. Be sure to avoid  all alcohol and illegal drugs.   Increase activity slowly  As directed        Medication List       Indication   albuterol 108 (90 BASE) MCG/ACT inhaler  Commonly known as:  PROVENTIL HFA;VENTOLIN HFA  Inhale 2 puffs into the lungs every 4 (four) hours as needed for wheezing or shortness of breath.      busPIRone 10 MG tablet  Commonly known as:  BUSPAR  Take 1 tablet (10 mg total) by mouth 2 (two) times daily. For anxiety.   Indication:  Anxiety Disorder, Symptoms of Feeling Anxious     FLUoxetine 10 MG capsule  Commonly known as:  PROZAC  Take 1 capsule (10 mg total) by mouth daily. For anxiety and depression.   Indication:  Depression           Follow-up Information   Follow up with [facility]  On 11/30/2012. (Please go to [facility]'s walk in clinic on Tuesday, November 30, 2012 or any weekday between 8am and 12 noon for medication managtment.)    Contact information:   [address] [place], [place]   [address]  [phone]      Follow-up recommendations:   Activities: Resume activity as tolerated. Diet: Heart healthy low sodium diet Tests: Follow up testing will be determined by your out patient provider.  Comments:    Total Discharge Time:  Greater than 30 minutes.  Signed: Rona Ravens. Mashburn RPAC 12:46 PM 11/30/2012  Patient seen approximately and evaluated for discharge suicidal risk assessment, case discussed with the treatment team and reviewed the information documented and agree with  the treatment plan.  Maleak Brazzel,JANARDHAHA R. 11/30/2012 7:02 PM

## 2012-11-29 NOTE — Tx Team (Signed)
Interdisciplinary Treatment Plan Update   Date Reviewed:  11/29/2012  Time Reviewed:  9:37 AM  Progress in Treatment:   Attending groups: Yes Participating in groups: Yes Taking medication as prescribed: Yes  Tolerating medication: Yes Family/Significant other contact made: No, patient declined collateral contact. Patient understands diagnosis: Yes  Discussing patient identified problems/goals with staff: Yes Medical problems stabilized or resolved: Yes Denies suicidal/homicidal ideation: Yes. Patient has not harmed self or others: Yes  For review of initial/current patient goals, please see plan of care.  Estimated Length of Stay:  Discharge today  Reasons for Continued Hospitalization:   New Problems/Goals identified:    Discharge Plan or Barriers:   Home with outpatient follow up at [facility].  Additional Comments:  N/A  Attendees:  Patient: [name] 11/29/2012 9:37 AM   Signature: Mervyn Gay, MD 11/29/2012 9:37 AM  Signature:  Verne Spurr, PA 11/29/2012 9:37 AM  Signature: Neill Loft, RN 11/29/2012 9:37 AM  Signature: Quintella Reichert, RN 11/29/2012 9:37 AM  Signature:   11/29/2012 9:37 AM  Signature:  Juline Patch, LCSW 11/29/2012 9:37 AM  Signature:  Reyes Ivan, LCSW 11/29/2012 9:37 AM  Signature:  Maseta Dorley,Care Coordinator 11/29/2012 9:37 AM  Signature:   11/29/2012 9:37 AM  Signature: 11/29/2012  9:37 AM  Signature:    Signature:      Scribe for Treatment Team:   Juline Patch,  11/29/2012 9:37 AM

## 2012-11-29 NOTE — [facility] Suicide Risk Assessment (Signed)
Suicide Risk Assessment  Discharge Assessment     Demographic Factors:  Male, Adolescent or young adult, Caucasian, Low socioeconomic status and Unemployed  Mental Status Per Nursing Assessment::   On Admission:  Suicidal ideation indicated by patient  Current Mental Status by Physician: Mental Status Examination: Patient appeared as per his stated age, casually dressed, and fairly groomed, and maintaining good eye contact. Patient has good mood and his affect was constricted. He has normal rate, rhythm, and volume of speech. His thought process is linear and goal directed. Patient has denied suicidal, homicidal ideations, intentions or plans. Patient has no evidence of auditory or visual hallucinations, delusions, and paranoia. Patient has fair insight judgment and impulse control.  Loss Factors: Financial problems/change in socioeconomic status  Historical Factors: Prior suicide attempts, Family history of mental illness or substance abuse and Impulsivity  Risk Reduction Factors:   Sense of responsibility to family, Religious beliefs about death, Living with another person, especially a relative, Positive social support, Positive therapeutic relationship and Positive coping skills or problem solving skills  Continued Clinical Symptoms:  Depression:   Recent sense of peace/wellbeing Unstable or Poor Therapeutic Relationship Previous Psychiatric Diagnoses and Treatments  Cognitive Features That Contribute To Risk:  Polarized thinking    Suicide Risk:  Minimal: No identifiable suicidal ideation.  Patients presenting with no risk factors but with morbid ruminations; may be classified as minimal risk based on the severity of the depressive symptoms  Discharge Diagnoses:   AXIS I:  Generalized Anxiety Disorder, Major Depression, Recurrent severe and Cannabis abuse AXIS II:  Deferred AXIS III:   Past Medical History  Diagnosis Date  . Psoriasis     right foot  . Asthma    triggered by cold weather; prn inhaler  . Anxiety     no current med.  . Fracture of metacarpal 02/13/2012    right small  . Depression    AXIS IV:  economic problems, housing problems, occupational problems, other psychosocial or environmental problems, problems related to social environment and problems with primary support group AXIS V:  61-70 mild symptoms  Plan Of Care/Follow-up recommendations:  Activity:  As tolerated Diet:  Regular  Is patient on multiple antipsychotic therapies at discharge:  No   Has Patient had three or more failed trials of antipsychotic monotherapy by history:  No  Recommended Plan for Multiple Antipsychotic Therapies: NA  Makye Radle,JANARDHAHA R. 11/29/2012, 1:23 PM

## 2012-12-01 NOTE — Progress Notes (Signed)
Patient Discharge Instructions:  After Visit Summary (AVS):   Faxed to:  12/02/02 Discharge Summary Note:   Faxed to:  12/01/12 Psychiatric Admission Assessment Note:   Faxed to:  12/01/12 Suicide Risk Assessment - Discharge Assessment:   Faxed to:  12/01/12 Faxed/Sent to the Next Level Care provider:  12/01/12 Faxed to [facility] @ [phone]  Jerelene Redden, 12/01/2012, 4:19 PM

## 2013-07-27 ENCOUNTER — Ambulatory Visit (INDEPENDENT_AMBULATORY_CARE_PROVIDER_SITE_OTHER): Payer: BC Managed Care – PPO | Admitting: Pediatrics

## 2013-07-27 ENCOUNTER — Encounter: Payer: Self-pay | Admitting: Pediatrics

## 2013-07-27 VITALS — BP 110/68 | Ht 66.1 in | Wt 132.4 lb

## 2013-07-27 DIAGNOSIS — Z113 Encounter for screening for infections with a predominantly sexual mode of transmission: Secondary | ICD-10-CM

## 2013-07-27 DIAGNOSIS — F32A Depression, unspecified: Secondary | ICD-10-CM

## 2013-07-27 DIAGNOSIS — F329 Major depressive disorder, single episode, unspecified: Secondary | ICD-10-CM

## 2013-07-27 DIAGNOSIS — F3289 Other specified depressive episodes: Secondary | ICD-10-CM

## 2013-07-27 MED ORDER — FLUOXETINE HCL 20 MG PO CAPS: 20.0000 mg | ORAL_CAPSULE | Freq: Every day | ORAL | Status: DC

## 2013-07-27 NOTE — Patient Instructions (Signed)
Increase Prozac (fluoxetine) to 20 mg by mouth once daily  Fluoxetine capsules or tablets (Depression/Mood Disorders) What is this medicine? FLUOXETINE (floo OX e teen) belongs to a class of drugs known as selective serotonin reuptake inhibitors (SSRIs). It helps to treat mood problems such as depression, obsessive compulsive disorder, and panic attacks. It can also treat certain eating disorders. This medicine may be used for other purposes; ask your health care provider or pharmacist if you have questions. COMMON BRAND NAME(S): Prozac What should I tell my health care provider before I take this medicine? They need to know if you have any of these conditions: -bipolar disorder or mania -diabetes -glaucoma -liver disease -psychosis -seizures -suicidal thoughts or history of attempted suicide -an unusual or allergic reaction to fluoxetine, other medicines, foods, dyes, or preservatives -pregnant or trying to get pregnant -breast-feeding How should I use this medicine? Take this medicine by mouth with a glass of water. Follow the directions on the prescription label. You can take this medicine with or without food. Take your medicine at regular intervals. Do not take it more often than directed. Do not stop taking this medicine suddenly except upon the advice of your doctor. Stopping this medicine too quickly may cause serious side effects or your condition may worsen. A special MedGuide will be given to you by the pharmacist with each prescription and refill. Be sure to read this information carefully each time. Talk to your pediatrician regarding the use of this medicine in children. While this drug may be prescribed for children as young as 7 years for selected conditions, precautions do apply. Overdosage: If you think you have taken too much of this medicine contact a poison control center or emergency room at once. NOTE: This medicine is only for you. Do not share this medicine with  others. What if I miss a dose? If you miss a dose, skip the missed dose and go back to your regular dosing schedule. Do not take double or extra doses. What may interact with this medicine? Do not take fluoxetine with any of the following medications: -other medicines containing fluoxetine, like Sarafem or Symbyax -cisapride -linezolid -MAOIs like Carbex, Eldepryl, Marplan, Nardil, and Parnate -methylene blue (injected into a vein) -pimozide -thioridazine This medicine may also interact with the following medications: -alcohol -aspirin and aspirin-like medicines -carbamazepine -certain medicines for depression, anxiety, or psychotic disturbances -certain medicines for migraine headaches like almotriptan, eletriptan, frovatriptan, naratriptan, rizatriptan, sumatriptan, zolmitriptan -digoxin -diuretics -fentanyl -flecainide -furazolidone -isoniazid -lithium -medicines for sleep -medicines that treat or prevent blood clots like warfarin, enoxaparin, and dalteparin -NSAIDs, medicines for pain and inflammation, like ibuprofen or naproxen -phenytoin -procarbazine -propafenone -rasagiline -ritonavir -supplements like St. John's wort, kava kava, valerian -tramadol -tryptophan -vinblastine This list may not describe all possible interactions. Give your health care provider a list of all the medicines, herbs, non-prescription drugs, or dietary supplements you use. Also tell them if you smoke, drink alcohol, or use illegal drugs. Some items may interact with your medicine. What should I watch for while using this medicine? Tell your doctor if your symptoms do not get better or if they get worse. Visit your doctor or health care professional for regular checks on your progress. Because it may take several weeks to see the full effects of this medicine, it is important to continue your treatment as prescribed by your doctor. Patients and their families should watch out for new or worsening  thoughts of suicide or depression. Also watch out for sudden  changes in feelings such as feeling anxious, agitated, panicky, irritable, hostile, aggressive, impulsive, severely restless, overly excited and hyperactive, or not being able to sleep. If this happens, especially at the beginning of treatment or after a change in dose, call your health care professional. Bonita Quin may get drowsy or dizzy. Do not drive, use machinery, or do anything that needs mental alertness until you know how this medicine affects you. Do not stand or sit up quickly, especially if you are an older patient. This reduces the risk of dizzy or fainting spells. Alcohol may interfere with the effect of this medicine. Avoid alcoholic drinks. Your mouth may get dry. Chewing sugarless gum or sucking hard candy, and drinking plenty of water may help. Contact your doctor if the problem does not go away or is severe. This medicine may affect blood sugar levels. If you have diabetes, check with your doctor or health care professional before you change your diet or the dose of your diabetic medicine. What side effects may I notice from receiving this medicine? Side effects that you should report to your doctor or health care professional as soon as possible: -allergic reactions like skin rash, itching or hives, swelling of the face, lips, or tongue -breathing problems -confusion -fast or irregular heart rate, palpitations -flu-like fever, chills, cough, muscle or joint aches and pains -seizures -suicidal thoughts or other mood changes -tremors -trouble sleeping -unusual bleeding or bruising -unusually tired or weak -vomiting Side effects that usually do not require medical attention (report to your doctor or health care professional if they continue or are bothersome): -blurred vision -change in sex drive or performance -diarrhea -dry mouth -flushing -headache -increased or decreased appetite -nausea -sweating This list may not  describe all possible side effects. Call your doctor for medical advice about side effects. You may report side effects to FDA at 1-800-FDA-1088. Where should I keep my medicine? Keep out of the reach of children. Store at room temperature between 15 and 30 degrees C (59 and 86 degrees F). Throw away any unused medicine after the expiration date. NOTE: This sheet is a summary. It may not cover all possible information. If you have questions about this medicine, talk to your doctor, pharmacist, or health care provider.  2014, Elsevier/Gold Standard. (2012-09-10 12:48:36)

## 2013-07-27 NOTE — Progress Notes (Signed)
Adolescent Medicine Consultation Initial Visit [name]  is a 23 y.o. male  here today for evaluation of depression.      PCP Confirmed?  Patient does not have a PCP and has had difficulty finding someone  Default, Provider, MD   History was provided by the patient.  Email:  [email or web address] Home:  [phone]      HPI:  Pt reports he is here for depression management Referred by his aunt who is Chiropodist for the [facility].  Reviewed h/o depression with patient. Started with depression age 89 yrs, got treatment around then too.  Went to [facility], and was off and on meds for the next few years.  Took Wellbutrin, then Remeron.  More recently was on celexa, then lexapro.  Now on Prozac which he states seems to be the best fit for him.  Has felt really great on it for the last 6 or 7 months and would like to stay that way.  Took 10 mg of Prozac and liked that dose.  Ran out in Feb 2015 and could not find care to refill it.  Mid April started having lack of motivation and depressed mood.  States his sleep and eating habits worsened at that point and thus are probably contributing to his low energy, poor motivation and depressed mood.   Stays up late and then sleeps until mid-day.  Stays on TV at night and does not fall asleep until he can't keep his eyes open.  Stays asleep without difficulty.  Does not eat until the afternoon.  Sometimes does not eat until early evening.  Appetite is very low so has to push himself to eat.  Hard to get out of bed and do anything.  Needs to do school work and chores, needs to look for job.  When on prozac prior to this, he did not have difficulty with motivation.  Start on Prozac after he got out of prison while in [facility].  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  When he first got out of prison, wanted to do the right thing but  couldn't get motivated.  Feels guilty about not doing anything but still can't motivate to do anything.  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  Feels supported by aunt and uncle, living with them.    Restarted Prozac 2 weeks ago with prescription from me.  No side effects from the prozac.  No headaches.  NO stomachache.    Used to have panic attacks also.  Was on buspar for those which did not help him.  Ativan helped the panic attacks but has not had any attacks in a year.  Also was on vistaril previously which he states did not help much.  He reports no panic attacks.    No LMP for male patient.  Screenings: PHQ-SADS Completed on: 07/27/13 PHQ-15:  3 GAD-7:  2 PHQ-9:  5 Reported problems make it somewhat difficult to complete activities of daily functioning.  ROS per HPI  Current Outpatient Prescriptions on File Prior to Visit  Medication Sig Dispense Refill  . FLUoxetine (PROZAC) 10 MG capsule Take 1 capsule (10 mg total) by mouth daily. For anxiety and depression.  30 capsule  0  . albuterol (PROVENTIL HFA;VENTOLIN HFA) 108 (90 BASE) MCG/ACT inhaler Inhale 2 puffs into the lungs every 4 (four) hours as needed for wheezing or  shortness of breath.       . busPIRone (BUSPAR) 10 MG tablet Take 1 tablet (10 mg total) by mouth 2 (two) times daily. For anxiety.  60 tablet  0   No current facility-administered medications on file prior to visit.    No Known Allergies  Past Medical History  Diagnosis Date  . Psoriasis     right foot  . Asthma     triggered by cold weather; prn inhaler  . Anxiety     no current med.  . Fracture of metacarpal 02/13/2012    right small  . Depression     No family history on file.  Social History:  Lives with: Maternal Aunt and her family Siblings: 20, 8617, 816, 4615 School: See above, going to school on line  Confidentiality was discussed with the patient and if applicable, with caregiver as well. Tobacco? yes, 1 pack every 1-2 days Secondhand  smoke exposure?no Drugs/EtOH?yes, h/o marijuana but not currently because he gets drug tested on parole, occasionally drinks alcohol Sexually active?yes, in the past  Safe at home, in school & in relationships? Yes Safe to self? 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  Physical Exam:  Filed Vitals:   07/27/13 1601  BP: 110/68  Height: 5' 6.1" (1.679 m)  Weight: 132 lb 6.4 oz (60.056 kg)   BP 110/68  Ht 5' 6.1" (1.679 m)  Wt 132 lb 6.4 oz (60.056 kg)  BMI 21.30 kg/m2 Body mass index: body mass index is 21.3 kg/(m^2). Facility age limit for growth percentiles is 20 years.  Physical Examination: General appearance - alert, well appearing, and in no distress Neck - supple, no significant adenopathy, thyroid exam: thyroid is normal in size without nodules or tenderness Chest - clear to auscultation, no wheezes, rales or rhonchi, symmetric air entry Heart - normal rate, regular rhythm, normal S1, S2, no murmurs, rubs, clicks or gallops Abdomen - soft, nontender, nondistended, no masses or organomegaly Extremities - no pedal edema noted   Assessment/Plan: 23 yo male with major depressive disorder who has improved some with Prozac 10 mg po daily.  No side effects. Advised increase to 20 mg.  Advised we should check TSH.  F/u in 1 month, sooner if needed.  Reviewed coping strategies.  Pt contracts for safety.  Medical decision-making:  > 30 minutes spent, more than 50% of appointment was spent discussing diagnosis and management of symptoms

## 2013-07-28 LAB — GC/CHLAMYDIA PROBE AMP, URINE
CHLAMYDIA, SWAB/URINE, PCR: NEGATIVE
GC Probe Amp, Urine: NEGATIVE

## 2013-08-11 ENCOUNTER — Ambulatory Visit: Payer: Self-pay | Admitting: Pediatrics

## 2014-06-06 ENCOUNTER — Emergency Department (HOSPITAL_COMMUNITY)
Admission: EM | Admit: 2014-06-06 | Discharge: 2014-06-07 | Disposition: A | Discharge status: Home / Self Care | Payer: Self-pay | Attending: Emergency Medicine | Admitting: Emergency Medicine

## 2014-06-06 ENCOUNTER — Encounter (HOSPITAL_COMMUNITY): Payer: Self-pay

## 2014-06-06 DIAGNOSIS — Z720 Tobacco use: Secondary | ICD-10-CM | POA: Insufficient documentation

## 2014-06-06 DIAGNOSIS — Z872 Personal history of diseases of the skin and subcutaneous tissue: Secondary | ICD-10-CM | POA: Insufficient documentation

## 2014-06-06 DIAGNOSIS — Z8781 Personal history of (healed) traumatic fracture: Secondary | ICD-10-CM | POA: Insufficient documentation

## 2014-06-06 DIAGNOSIS — R45851 Suicidal ideations: Secondary | ICD-10-CM | POA: Insufficient documentation

## 2014-06-06 DIAGNOSIS — J45909 Unspecified asthma, uncomplicated: Secondary | ICD-10-CM | POA: Insufficient documentation

## 2014-06-06 LAB — COMPREHENSIVE METABOLIC PANEL
ALBUMIN: 5 g/dL (ref 3.5–5.2)
ALT: 13 U/L (ref 0–53)
ANION GAP: 11 (ref 5–15)
AST: 16 U/L (ref 0–37)
Alkaline Phosphatase: 42 U/L (ref 39–117)
BILIRUBIN TOTAL: 0.6 mg/dL (ref 0.3–1.2)
BUN: 8 mg/dL (ref 6–23)
CO2: 26 mmol/L (ref 19–32)
Calcium: 9.8 mg/dL (ref 8.4–10.5)
Chloride: 103 mmol/L (ref 96–112)
Creatinine, Ser: 0.91 mg/dL (ref 0.50–1.35)
GFR calc non Af Amer: >90 mL/min (ref >90)
Glucose, Bld: 88 mg/dL (ref 70–99)
POTASSIUM: 3.7 mmol/L (ref 3.5–5.1)
SODIUM: 140 mmol/L (ref 135–145)
TOTAL PROTEIN: 7.7 g/dL (ref 6.0–8.3)

## 2014-06-06 LAB — RAPID URINE DRUG SCREEN, HOSP PERFORMED
AMPHETAMINES: NOT DETECTED
BARBITURATES: NOT DETECTED
Benzodiazepines: NOT DETECTED
Cocaine: NOT DETECTED
Opiates: NOT DETECTED
Tetrahydrocannabinol: POSITIVE — AB

## 2014-06-06 LAB — ACETAMINOPHEN LEVEL

## 2014-06-06 LAB — CBC
HEMATOCRIT: 48.2 % (ref 39.0–52.0)
HEMOGLOBIN: 16.2 g/dL (ref 13.0–17.0)
MCH: 30.1 pg (ref 26.0–34.0)
MCHC: 33.6 g/dL (ref 30.0–36.0)
MCV: 89.6 fL (ref 78.0–100.0)
PLATELETS: 299 10*3/uL (ref 150–400)
RBC: 5.38 MIL/uL (ref 4.22–5.81)
RDW: 12.8 % (ref 11.5–15.5)
WBC: 8.9 10*3/uL (ref 4.0–10.5)

## 2014-06-06 LAB — SALICYLATE LEVEL: Salicylate Lvl: <4 mg/dL (ref 2.8–20.0)

## 2014-06-06 LAB — ETHANOL: ALCOHOL ETHYL (B): 20 mg/dL — AB (ref 0–9)

## 2014-06-06 MED ORDER — IBUPROFEN 200 MG PO TABS: 600.0000 mg | ORAL_TABLET | Freq: Three times a day (TID) | ORAL | Status: DC | PRN

## 2014-06-06 MED ORDER — LORAZEPAM 1 MG PO TABS: 1.0000 mg | ORAL_TABLET | Freq: Three times a day (TID) | ORAL | Status: DC | PRN

## 2014-06-06 MED ORDER — NICOTINE 21 MG/24HR TD PT24: 21.0000 mg | MEDICATED_PATCH | Freq: Every day | TRANSDERMAL | Status: DC

## 2014-06-06 MED ORDER — ACETAMINOPHEN 325 MG PO TABS: 650.0000 mg | ORAL_TABLET | ORAL | Status: DC | PRN

## 2014-06-06 MED ORDER — ALUM & MAG HYDROXIDE-SIMETH 200-200-20 MG/5ML PO SUSP: 30.0000 mL | ORAL | Status: DC | PRN

## 2014-06-06 MED ORDER — ONDANSETRON HCL 4 MG PO TABS: 4.0000 mg | ORAL_TABLET | Freq: Three times a day (TID) | ORAL | Status: DC | PRN

## 2014-06-06 MED ORDER — ZOLPIDEM TARTRATE 5 MG PO TABS: 5.0000 mg | ORAL_TABLET | Freq: Every evening | ORAL | Status: DC | PRN

## 2014-06-06 NOTE — ED Provider Notes (Signed)
CSN: [id number]     Arrival date & time 06/06/14  2203 History   First MD Initiated Contact with Patient 06/06/14 2207     Chief Complaint  Patient presents with  . Suicidal     (Consider location/radiation/quality/duration/timing/severity/associated sxs/prior Treatment) HPI Comments: Here because he was walking around the house with a gun making suicidal threats.  Patient is a 24 y.o. male presenting with mental health disorder. The history is provided by the patient.  Mental Health Problem Presenting symptoms: suicidal thoughts and suicidal threats   Degree of incapacity (severity):  Severe Onset quality:  Gradual Timing:  Constant Progression:  Worsening Context: stressful life event   Context: not noncompliant and not recent medication change   Treatment compliance:  Untreated Relieved by:  Nothing Worsened by:  Nothing tried   Past Medical History  Diagnosis Date  . Psoriasis     right foot  . Asthma     triggered by cold weather; prn inhaler  . Anxiety     no current med.  . Fracture of metacarpal 02/13/2012    right small  . Depression    Past Surgical History  Procedure Laterality Date  . Tonsillectomy  age 38  . Closed reduction finger with percutaneous pinning  02/16/2012    Procedure: CLOSED REDUCTION FINGER WITH PERCUTANEOUS PINNING;  Surgeon: Marlowe Shores, MD;  Location: [facility];  Service: Orthopedics;  Laterality: Right;  CLOSED REDUCTION PINNING RT SMALL METACARPAL FX.   No family history on file. History  Substance Use Topics  . Smoking status: Current Every Day Smoker -- 1.00 packs/day for 6 years    Types: Cigarettes  . Smokeless tobacco: Never Used  . Alcohol Use: Yes     Comment: occasionally    Review of Systems  Respiratory: Negative for cough and shortness of breath.   Psychiatric/Behavioral: Positive for suicidal ideas.  All other systems reviewed and are negative.     Allergies  Review of patient's allergies  indicates no known allergies.  Home Medications   Prior to Admission medications   Medication Sig Start Date End Date Taking? Authorizing Provider  FLUoxetine (PROZAC) 20 MG capsule Take 1 capsule (20 mg total) by mouth daily. Patient not taking: Reported on 06/06/2014 07/27/13   Owens Shark, MD   BP 132/81 mmHg  Pulse 68  Temp(Src) 98.1 F (36.7 C) (Oral)  Resp 18  SpO2 99% Physical Exam  Constitutional: He is oriented to person, place, and time. He appears well-developed and well-nourished. No distress.  HENT:  Head: Normocephalic and atraumatic.  Mouth/Throat: No oropharyngeal exudate.  Eyes: EOM are normal. Pupils are equal, round, and reactive to light.  Neck: Normal range of motion. Neck supple.  Cardiovascular: Normal rate and regular rhythm.  Exam reveals no friction rub.   No murmur heard. Pulmonary/Chest: Effort normal and breath sounds normal. No respiratory distress. He has no wheezes. He has no rales.  Abdominal: Soft. He exhibits no distension. There is no tenderness. There is no rebound.  Musculoskeletal: Normal range of motion. He exhibits no edema.  Neurological: He is alert and oriented to person, place, and time. No cranial nerve deficit. He exhibits normal muscle tone. Coordination normal.  Skin: No rash noted. He is not diaphoretic.  Nursing note and vitals reviewed.   ED Course  Procedures (including critical care time) Labs Review Labs Reviewed  CBC  ACETAMINOPHEN LEVEL  COMPREHENSIVE METABOLIC PANEL  ETHANOL  SALICYLATE LEVEL  URINE RAPID DRUG SCREEN (  HOSP PERFORMED)    Imaging Review No results found.   EKG Interpretation None      MDM   Final diagnoses:  None    24 year old male here suicidal. Was walking around his house with a done any said, "I'm angry didn't pull the trigger but it didn't have the nerve." Stressed because he is living with the mother of his children and she is cheating on him. IVC placed. Psych  consulted.  Elwin MochaBlair Neilani Duffee, MD 06/07/14 Marlyne Beards0002

## 2014-06-06 NOTE — ED Notes (Signed)
Pt. and belongings wanded by security 

## 2014-06-06 NOTE — BH Assessment (Addendum)
Tele Assessment Note   [name] is a 24 y.o. male presenting to [facility] due to suicidal ideation with plan and intent. Pt was reportedly walking around his house with a gun for approximately an hour contemplating suicide prior to coming in to the ED. He reports feeling angry that he didn't pull the trigger, but he states, "I didn't have the nerve to do it." Pt presents with irritable affect, depressed mood, and fair eye-contact. Speech is linear but aggressive and pt uses profanity. Thought pattern is coherent and relevant with no evidence of delusional thought content. Pt does not appear to be responding to internal stimuli. Pt states that he is under increased stress at the moment because he is living with the mother of his children who is reportedly cheating on him. Pt came into ED voluntarily but was IVC'ed by EDP due to his level of danger to self. [facility] for SI. Pt reports that his children are his primary deterrent for not having yet committed suicide. Pt acknowledges drinking a 6-pk of alcohol 4-5 days out of the week. He also endorses marijuana use. Pt denies A/VH. He denies HI and hx of any type of abuse.  Per Donell SievertSpencer Simon, PA, pt meets inpt tx criteria. [facility], pending bed availability.   Axis I: 296.33 Major depressive disorder, Recurrent episode, Severe            R/O Alcohol Use Disorder Axis II: Deferred Axis III:  Past Medical History  Diagnosis Date  . Psoriasis     right foot  . Asthma     triggered by cold weather; prn inhaler  . Anxiety     no current med.  . Fracture of metacarpal 02/13/2012    right small  . Depression    Axis IV: economic problems, educational problems, housing problems, occupational problems, other psychosocial or environmental problems, problems related to legal system/crime, problems related to social environment, problems with access to health care services and problems  with primary support group Axis V: 31-40 impairment in reality testing  Past Medical History:  Past Medical History  Diagnosis Date  . Psoriasis     right foot  . Asthma     triggered by cold weather; prn inhaler  . Anxiety     no current med.  . Fracture of metacarpal 02/13/2012    right small  . Depression     Past Surgical History  Procedure Laterality Date  . Tonsillectomy  age 89  . Closed reduction finger with percutaneous pinning  02/16/2012    Procedure: CLOSED REDUCTION FINGER WITH PERCUTANEOUS PINNING;  Surgeon: Marlowe ShoresMatthew A Weingold, MD;  Location: [facility];  Service: Orthopedics;  Laterality: Right;  CLOSED REDUCTION PINNING RT SMALL METACARPAL FX.    Family History: No family history on file.  Social History:  reports that he has been smoking Cigarettes.  He has a 6 pack-year smoking history. He has never used smokeless tobacco. He reports that he drinks alcohol. He reports that he does not use illicit drugs.  Additional Social History:  Alcohol / Drug Use Pain Medications: See PTA List Prescriptions: See PTA List Over the Counter: See PTA List History of alcohol / drug use?: Yes Longest period of sobriety (when/how long): Unknown Negative Consequences of Use:  (Denies) Withdrawal Symptoms:  (Denies) Substance #1 Name of Substance 1: Etoh 1 - Age of First Use: Teens 1 -  Amount (size/oz): 6 pack 1 - Frequency: 4-5x per week 1 - Duration: Several years 1 - Last Use / Amount: yesterday 06/05/14  CIWA: CIWA-Ar BP: 132/81 mmHg Pulse Rate: 68 COWS:    PATIENT STRENGTHS: (choose at least two) Ability for insight Average or above average intelligence Capable of independent living Communication skills Physical Health Work skills  Allergies: No Known Allergies  Home Medications:  (Not in a hospital admission)  OB/GYN Status:  No LMP for male patient.  General Assessment Data [facility] of [facility]: [facility] Is this a Tele or Face-to-Face  Assessment?: Face-to-Face Is this an Initial Assessment or a Re-assessment for this encounter?: Initial Assessment Living Arrangements: Spouse/significant other, Children Can pt return to current living arrangement?: Yes Admission Status: Voluntary Is patient capable of signing voluntary admission?: No Transfer from: Home Referral Source: Self/Family/Friend     [facility] Crisis Care Plan Living Arrangements: Spouse/significant other, Children Name of Psychiatrist: None Name of Therapist: None  Education Status Is patient currently in school?: No Current Grade: na Highest grade of school patient has completed: na Name of school: na Contact person: na  Risk to self with the past 6 months Suicidal Ideation: Yes-Currently Present Suicidal Intent: Yes-Currently Present Is patient at risk for suicide?: Yes Suicidal Plan?: Yes-Currently Present Specify Current Suicidal Plan: Shoot self Access to Means: Yes Specify Access to Suicidal Means: Firearm access What has been your use of drugs/alcohol within the last 12 months?: None Previous Attempts/Gestures: Yes How many times?: 1 Other Self Harm Risks: No Triggers for Past Attempts: Unknown Intentional Self Injurious Behavior: None Comment - Self Injurious Behavior: n/a Family Suicide History: Unknown Recent stressful life event(s): Conflict (Comment) Persecutory voices/beliefs?: No Depression: Yes Depression Symptoms: Insomnia, Tearfulness, Isolating, Fatigue, Feeling worthless/self pity, Feeling angry/irritable Substance abuse history and/or treatment for substance abuse?: No Suicide prevention information given to non-admitted patients: Not applicable  Risk to Others within the past 6 months Homicidal Ideation: No Thoughts of Harm to Others: No Current Homicidal Intent: No Current Homicidal Plan: No Access to Homicidal Means: No Identified Victim: n/a History of harm to others?: No Assessment of Violence: None Noted Violent  Behavior Description: none Does patient have access to weapons?: Yes (Comment) Criminal Charges Pending?: No Does patient have a court date: No  Psychosis Hallucinations: None noted Delusions: None noted  Mental Status Report Appearance/Hygiene: In scrubs Eye Contact: Fair Motor Activity: Freedom of movement Speech: Logical/coherent Level of Consciousness: Irritable Mood: Depressed Affect: Irritable Anxiety Level: Minimal Thought Processes: Coherent, Relevant Judgement: Impaired Orientation: Person, Place, Time, Situation Obsessive Compulsive Thoughts/Behaviors: None  Cognitive Functioning Concentration: Fair Memory: Recent Intact IQ: Average Insight: Poor Impulse Control: Poor Appetite: Fair Weight Loss: 0 Weight Gain: 0 Sleep: No Change Total Hours of Sleep: 6 Vegetative Symptoms: None  ADLScreening [facility] Assessment Services) Patient's cognitive ability adequate to safely complete daily activities?: Yes Patient able to express need for assistance with ADLs?: Yes Independently performs ADLs?: Yes (appropriate for developmental age)  Prior Inpatient Therapy Prior Inpatient Therapy: Yes Prior Therapy Dates: 08/2012, 11/2012 Prior Therapy Facilty/Provider(s): [facility] Reason for Treatment: SI  Prior Outpatient Therapy Prior Outpatient Therapy: No Prior Therapy Dates: n/a Prior Therapy Facilty/Provider(s): n/a Reason for Treatment: n/a  ADL Screening (condition at time of admission) Patient's cognitive ability adequate to safely complete daily activities?: Yes Is the patient deaf or have difficulty hearing?: No Does the patient have difficulty seeing, even when wearing glasses/contacts?: No Does the patient have difficulty concentrating, remembering, or making decisions?: No Patient  able to express need for assistance with ADLs?: Yes Does the patient have difficulty dressing or bathing?: No Independently performs ADLs?: Yes (appropriate for developmental age) Does  the patient have difficulty walking or climbing stairs?: No Weakness of Legs: None Weakness of Arms/Hands: None  Home Assistive Devices/Equipment Home Assistive Devices/Equipment: None    Abuse/Neglect Assessment (Assessment to be complete while patient is alone) Physical Abuse: Denies Verbal Abuse: Denies Sexual Abuse: Denies Exploitation of patient/patient's resources: Denies Self-Neglect: Denies Values / Beliefs Cultural Requests During Hospitalization: None Spiritual Requests During Hospitalization: None   Advance Directives (For Healthcare) Does patient have an advance directive?: No Would patient like information on creating an advanced directive?: No - patient declined information    Additional Information 1:1 In Past 12 Months?: No CIRT Risk: No Elopement Risk: No Does patient have medical clearance?: Yes     Disposition: Per Donell Sievert, PA, pt meets inpt tx criteria. Pt is appropriate for [facility] 400-hall, pending bed availability.  Disposition Initial Assessment Completed for this Encounter: Yes Disposition of Patient: Inpatient treatment program Type of inpatient treatment program: Adult  Cyndie Mull, [facility] Triage Specialist  06/07/2014 1:59 AM

## 2014-06-06 NOTE — ED Notes (Signed)
Pt presents with c/o suicidal thoughts. Pt reports he walked around prior to coming in for an hour with a gun and he is "pissed that he didn't go through with it". Pt reports that he didn't have the nerve to do it. Pt is tearful in triage and when asked about what is going on, he says "it's a long story and I don't really want to get into it".

## 2014-06-07 ENCOUNTER — Inpatient Hospital Stay (HOSPITAL_COMMUNITY)
Admission: EM | Admit: 2014-06-07 | Discharge: 2014-06-10 | DRG: 880 | Disposition: A | Discharge status: Home / Self Care | Payer: Federal, State, Local not specified - Other | Source: Intra-hospital | Attending: Psychiatry | Admitting: Psychiatry

## 2014-06-07 ENCOUNTER — Encounter (HOSPITAL_COMMUNITY): Payer: Self-pay

## 2014-06-07 DIAGNOSIS — F17210 Nicotine dependence, cigarettes, uncomplicated: Secondary | ICD-10-CM | POA: Diagnosis present

## 2014-06-07 DIAGNOSIS — F410 Panic disorder [episodic paroxysmal anxiety] without agoraphobia: Secondary | ICD-10-CM | POA: Diagnosis not present

## 2014-06-07 DIAGNOSIS — J45909 Unspecified asthma, uncomplicated: Secondary | ICD-10-CM | POA: Diagnosis present

## 2014-06-07 DIAGNOSIS — F329 Major depressive disorder, single episode, unspecified: Secondary | ICD-10-CM | POA: Diagnosis present

## 2014-06-07 DIAGNOSIS — F1220 Cannabis dependence, uncomplicated: Secondary | ICD-10-CM | POA: Diagnosis not present

## 2014-06-07 DIAGNOSIS — Z720 Tobacco use: Secondary | ICD-10-CM | POA: Diagnosis not present

## 2014-06-07 DIAGNOSIS — F4321 Adjustment disorder with depressed mood: Secondary | ICD-10-CM | POA: Diagnosis present

## 2014-06-07 DIAGNOSIS — R45851 Suicidal ideations: Secondary | ICD-10-CM | POA: Diagnosis present

## 2014-06-07 DIAGNOSIS — F17200 Nicotine dependence, unspecified, uncomplicated: Secondary | ICD-10-CM | POA: Diagnosis present

## 2014-06-07 DIAGNOSIS — F1994 Other psychoactive substance use, unspecified with psychoactive substance-induced mood disorder: Secondary | ICD-10-CM | POA: Diagnosis present

## 2014-06-07 LAB — TSH: TSH: 0.476 u[IU]/mL (ref 0.350–4.500)

## 2014-06-07 MED ORDER — TRAZODONE HCL 50 MG PO TABS
50.0000 mg | ORAL_TABLET | Freq: Every evening | ORAL | Status: DC | PRN
  Administered 2014-06-07 – 2014-06-09 (×3): 50 mg via ORAL
  Filled 2014-06-07 (×2): qty 1
  Filled 2014-06-07 (×2): qty 6
  Filled 2014-06-07 (×4): qty 1
  Filled 2014-06-07: qty 6
  Filled 2014-06-07: qty 1
  Filled 2014-06-07: qty 6
  Filled 2014-06-07: qty 1

## 2014-06-07 MED ORDER — ACETAMINOPHEN 325 MG PO TABS: 650.0000 mg | ORAL_TABLET | Freq: Four times a day (QID) | ORAL | Status: DC | PRN

## 2014-06-07 MED ORDER — ALUM & MAG HYDROXIDE-SIMETH 200-200-20 MG/5ML PO SUSP: 30.0000 mL | ORAL | Status: DC | PRN

## 2014-06-07 MED ORDER — GABAPENTIN 100 MG PO CAPS
100.0000 mg | ORAL_CAPSULE | ORAL | Status: DC
  Administered 2014-06-07 – 2014-06-10 (×9): 100 mg via ORAL
  Filled 2014-06-07 (×2): qty 9
  Filled 2014-06-07 (×2): qty 1
  Filled 2014-06-07: qty 9
  Filled 2014-06-07 (×2): qty 1
  Filled 2014-06-07: qty 9
  Filled 2014-06-07 (×5): qty 1
  Filled 2014-06-07 (×2): qty 9
  Filled 2014-06-07 (×3): qty 1

## 2014-06-07 MED ORDER — HYDROXYZINE HCL 25 MG PO TABS
25.0000 mg | ORAL_TABLET | Freq: Four times a day (QID) | ORAL | Status: DC | PRN
  Filled 2014-06-07: qty 1
  Filled 2014-06-07: qty 10

## 2014-06-07 MED ORDER — NICOTINE 21 MG/24HR TD PT24
21.0000 mg | MEDICATED_PATCH | Freq: Every day | TRANSDERMAL | Status: DC
  Administered 2014-06-07 – 2014-06-10 (×4): 21 mg via TRANSDERMAL
  Filled 2014-06-07 (×8): qty 1

## 2014-06-07 MED ORDER — VENLAFAXINE HCL ER 37.5 MG PO CP24
37.5000 mg | ORAL_CAPSULE | Freq: Every day | ORAL | Status: DC
  Administered 2014-06-07 – 2014-06-10 (×4): 37.5 mg via ORAL
  Filled 2014-06-07 (×2): qty 1
  Filled 2014-06-07: qty 3
  Filled 2014-06-07 (×3): qty 1
  Filled 2014-06-07: qty 3

## 2014-06-07 MED ORDER — MAGNESIUM HYDROXIDE 400 MG/5ML PO SUSP: 30.0000 mL | Freq: Every day | ORAL | Status: DC | PRN

## 2014-06-07 MED ORDER — HYDROXYZINE HCL 25 MG PO TABS
ORAL_TABLET | ORAL | Status: AC
  Filled 2014-06-07: qty 1

## 2014-06-07 NOTE — Progress Notes (Signed)
D: Pt is a 24 yr old male IVC for SI. Pt reported that he did not feel like elaborating at this time about his admission here. However pt did admit to some SI. Pt is currently denying a plan. Pt reported no access to guns. However it was reported that this pt was walking around the house with a gun contemplating suicide. Pt verbalizes having social, financial, emotional, and "love" life stress. Pt verbally contracts for safety. Pt denies any HI/AVH. Pt reports the recent loss of his job. He is currently living with his children and their mother. Pt forwards a little with Clinical research associatewriter. Pt had minimal eye contact with Clinical research associatewriter. Pt endorses SI, depression, anxiety, and hopelessness. Pt identifies himself as his only support. Pt reports drinking 1-6 beers a "couple times of week" to Clinical research associatewriter. Per, report pt drinks this amount about 4-5 days weekly. Pt noted for superficial cuts on his LFA (one that appears more recent). Pt cut himself wit a razor blade. Pt denied this as an event that happened directly before his trip to the ED on 06/06/14. Pt's goal is "not to feel crazy". Fluids and food offered. Ginger ale given to pt.

## 2014-06-07 NOTE — Progress Notes (Signed)
Recreation Therapy Notes  Date: 04.06.2016 Time: 9:30am Location: 300 Hall Group Room   Group Topic: Stress Management  Goal Area(s) Addresses:  Patient will actively participate in stress management techniques presented during session.   Behavioral Response: Did not attend.   Kaelyn Nauta L Pershing Skidmore, LRT/CTRS  Verble Styron L 06/07/2014 2:22 PM 

## 2014-06-07 NOTE — BHH Counselor (Addendum)
Per Donell SievertSpencer Simon, PA, pt meets inpt tx criteria. [facility], pending bed availability.

## 2014-06-07 NOTE — ED Notes (Signed)
Pt sleeping at present, no distress noted, respirations even and unlabored.  Monitoring for safety, Q 15 min checks in effect.

## 2014-06-07 NOTE — B[facility] Suicide Risk Assessment (Signed)
[facility] INPATIENT:  Family/Significant Other Suicide Prevention Education  Suicide Prevention Education:  Patient Refusal for Family/Significant Other Suicide Prevention Education: The patient [name] has refused to provide written consent for family/significant other to be provided Family/Significant Other Suicide Prevention Education during admission and/or prior to discharge.  Physician notified.  Wynn BankerHodnett, Diamone Whistler Hairston 06/07/2014, 4:19 PM
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
## 2014-06-07 NOTE — Progress Notes (Signed)
D: Patient's affect blunted and mood is depressed. He reported on the self inventory sheet that he's sleeping fair, poor appetite and ability to concentrate and normal energy level. Patient rates depression "3" and feelings of hopelessness/anxiety "4". Writer observes that the patient has been resting in bed the majority of the day, besides coming out to speak with the social worker and taking new ordered medication at the window.  A: Support and encouragement provided to patient. Administered medications per ordering MD. Monitor Q15 minute checks for safety.  R: Patient receptive. Denies SI/HI/AVH. Patient remains safe on the unit.

## 2014-06-07 NOTE — [facility] Group Notes (Signed)
[facility] L[facility]  Emotional Regulations 1:15 - 2:30 PM  06/07/2014 3:13 PM  Type of Therapy:  Group Therapy  Participation Level:  Did Not Attend  Wynn BankerHodnett, Geneve Kimpel Hairston 06/07/2014, 3:13 PM

## 2014-06-07 NOTE — ED Notes (Signed)
Pt reports he had a bad freak out moment today, an argument with someone, will not elaborate further.  Denies SI or HI no AV hallucinations.  Pt reports he has been diagnosed with Anxiety and Depression in the past.  Denies feeling hopeless, calm & cooperative.  Pt admits to drinking a 6 pack 4-5 days per week.  Monitoring for safety, Q 15 min checks in effect.

## 2014-06-07 NOTE — B[facility] Suicide Risk Assessment (Signed)
[facility] Admission Suicide Risk Assessment   Nursing information obtained from:  Patient Demographic factors:  Male, Adolescent or young adult, Caucasian, Unemployed Current Mental Status:  Suicidal ideation indicated by patient Loss Factors:  Decrease in vocational status, Financial problems / change in socioeconomic status Historical Factors:  Prior suicide attempts, Family history of mental illness or substance abuse Risk Reduction Factors:  Responsible for children under 3 years of age, Sense of responsibility to family, Living with another person, especially a relative Total Time spent with patient: 30 minutes Principal Problem: Panic disorder Diagnosis:   Patient Active Problem List   Diagnosis Date Noted  . Panic disorder [F41.0] 06/07/2014  . Adjustment disorder with depressed mood [F43.21] 06/07/2014  . Cannabis use disorder, moderate, dependence [F12.20] 06/07/2014  . Tobacco use disorder [Z72.0] 06/07/2014     Continued Clinical Symptoms:  Alcohol Use Disorder Identification Test Final Score (AUDIT): 10 The "Alcohol Use Disorders Identification Test", Guidelines for Use in Primary Care, Second Edition.  World Science writer [facility]). Score between 0-7:  no or low risk or alcohol related problems. Score between 8-15:  moderate risk of alcohol related problems. Score between 16-19:  high risk of alcohol related problems. Score 20 or above:  warrants further diagnostic evaluation for alcohol dependence and treatment.   CLINICAL FACTORS:   Panic Attacks Alcohol/Substance Abuse/Dependencies Unstable or Poor Therapeutic Relationship Previous Psychiatric Diagnoses and Treatments   Musculoskeletal: Strength & Muscle Tone: within normal limits Gait & Station: normal Patient leans: N/A  Psychiatric Specialty Exam: Physical Exam  ROS  Blood pressure 136/71, pulse 70, temperature 97.6 F (36.4 C), temperature source Oral, resp. rate 16, height  (1.676 m), weight 53.978  kg (119 lb).Body mass index is 19.22 kg/(m^2).  General Appearance: Fairly Groomed  Patent attorney::  Good  Speech:  Clear and Coherent  Volume:  Normal  Mood:  Anxious  Affect:  Congruent  Thought Process:  Coherent  Orientation:  Full (Time, Place, and Person)  Thought Content:  Rumination  Suicidal Thoughts:  presented after having SI with plan to shoot self , currently denies it  Homicidal Thoughts:  No  Memory:  Immediate;   Good Recent;   Good Remote;   Good  Judgement:  Impaired  Insight:  Lacking  Psychomotor Activity:  Normal  Concentration:  Fair  Recall:  [employer] of Knowledge:Fair  Language: Fair  Akathisia:  No  Handed:  Right  AIMS (if indicated):     Assets:  Communication Skills Desire for Improvement Physical Health Talents/Skills  Sleep:     Cognition: WNL  ADL's:  Intact     COGNITIVE FEATURES THAT CONTRIBUTE TO RISK:  Polarized thinking    SUICIDE RISK:   Severe:  Frequent, intense, and enduring suicidal ideation, specific plan, no subjective intent, but some objective markers of intent (i.e., choice of lethal method), the method is accessible, some limited preparatory behavior, evidence of impaired self-control, severe dysphoria/symptomatology, multiple risk factors present, and few if any protective factors, particularly a lack of social support.  PLAN OF CARE: Patient will benefit from inpatient treatment and stabilization.  Estimated length of stay is 5-7 days.  Reviewed past medical records,treatment plan.  Will start a trial of Effexor XR 37.5 mg po daily for affective sx. Patient has been tried on prozac, lexapro,buspar,remeron,wellbutrin,celexa. Will continue Trazodone 50 mg po qhs for sleep. Will add Gabapentin for anxiety sx , patient reports Buspar made him sick and Vistaril did not help. Will continue to monitor  vitals ,medication compliance and treatment side effects while patient is here.  Will monitor for medical issues as well as call  consult as needed.  Reviewed labs ,will order as needed.  CSW will start working on disposition.  Patient to participate in therapeutic milieu .       Medical Decision Making:  Review of Psycho-Social Stressors (1), Review or order clinical lab tests (1), Review of Last Therapy Session (1), Review of Medication Regimen & Side Effects (2) and Review of New Medication or Change in Dosage (2)  I certify that inpatient services furnished can reasonably be expected to improve the patient's condition.   Beila Purdie md 06/07/2014, 1:30 PM

## 2014-06-07 NOTE — ED Notes (Signed)
[facility]. [facility] requested.

## 2014-06-07 NOTE — Tx Team (Signed)
Initial Interdisciplinary Treatment Plan   PATIENT STRESSORS: Financial difficulties Marital or family conflict Substance abuse   PATIENT STRENGTHS: Ability for insight General fund of knowledge Motivation for treatment/growth Physical Health   PROBLEM LIST: Problem List/Patient Goals Date to be addressed Date deferred Reason deferred Estimated date of resolution  Increased risk for SI 4/6   D/c  " Not to feel Crazy"       Depression      Anxiety      "Sometimes I think I drink too much"                                DISCHARGE CRITERIA:  Improved stabilization in mood, thinking, and/or behavior Medical problems require only outpatient monitoring Motivation to continue treatment in Anthony less acute level of care Reduction of life-threatening or endangering symptoms to within safe limits Verbal commitment to aftercare and medication compliance  PRELIMINARY DISCHARGE PLAN: Attend PHP/IOP  PATIENT/FAMIILY INVOLVEMENT: This treatment plan has been presented to and reviewed with the patient, [name].  The patient and family have been given the opportunity to ask questions and make suggestions.  [name] 06/07/2014, 6:25 AM

## 2014-06-07 NOTE — BHH Counselor (Addendum)
Per Donell SievertSpencer Simon, PA, pt meets inpt tx criteria. [facility] Dr. Jama Flavorsobos.  EDP (Dr. Mora Bellmanni) informed of disposition and need for transfer. Counselor also informed pt's attending RN, Joanie CoddingtonLatricia.  [facility].   Cyndie MullAnna Derrell Milanes, [facility] Triage Specialist

## 2014-06-07 NOTE — H&P (Signed)
Psychiatric Admission Assessment Adult  Patient Identification: [name] MRN:  [id number] Date of Evaluation:  06/07/2014 Chief Complaint:  MDD Principal Diagnosis: Panic disorder Diagnosis:   Patient Active Problem List   Diagnosis Date Noted  . Panic disorder [F41.0] 06/07/2014  . Adjustment disorder with depressed mood [F43.21] 06/07/2014  . Cannabis use disorder, moderate, dependence [F12.20] 06/07/2014  . Tobacco use disorder [Z72.0] 06/07/2014   History of Present Illness: [name] is a 24 y.o. male initially presenting to [facility] due to suicidal ideation with plan and intent.  Per report, [name] was reportedly walking around his house with a gun for approximately an hour contemplating suicide prior to coming in to the ED.  He stated that it was due to the relationship he has with the mother of his children.  "She takes me to another level.  She is bad for me."  He reported not feeling angry that he didn't pull the trigger as he thought anout his children and did nt have to nerve to go through with it.   Patient was seen today.  He appears to be in a depressed mood but with fair eye-contact.  His thought pattern is coherent and relevant with no evidence of delusional thought content. Pt does not appear to be responding to internal stimuli. Pt states that he is under increased stress at the moment because he is living with the mother of his children who is reportedly cheating on him. Pt came into ED voluntarily but was IVC'ed by EDP due to his level of danger to self. Pt was last hospitalized at [facility] in July and Sept of 2014 for SI. Pt reports that his children are his primary deterrent for not having yet committed suicide. Pt acknowledges drinking a 6-pk of alcohol 4-5 days out of the week. He also endorses marijuana use. Pt denies A/VH. He denies HI and hx of any type of abuse.  Elements:  Location:  suicidal ideation. Quality:  feel hopeless, anger, anxiety. Severity:   severe. Timing:  in the last few months. Duration:  "[name] been depressed since I was 24 years old". Context:  see HPI. Associated Signs/Symptoms: Depression Symptoms:  depressed mood, suicidal attempt, anxiety, (Hypo) Manic Symptoms:  Impulsivity, Irritable Mood, Labiality of Mood, Anxiety Symptoms:  Social Anxiety, Psychotic Symptoms:  na PTSD Symptoms: na Total Time spent with patient: 30 minutes  Past Medical History:  Past Medical History  Diagnosis Date  . Psoriasis     right foot  . Asthma     triggered by cold weather; prn inhaler  . Anxiety     no current med.  . Fracture of metacarpal 02/13/2012    right small  . Depression     Past Surgical History  Procedure Laterality Date  . Tonsillectomy  age 33  . Closed reduction finger with percutaneous pinning  02/16/2012    Procedure: CLOSED REDUCTION FINGER WITH PERCUTANEOUS PINNING;  Surgeon: Schuyler Amor, MD;  Location: [facility];  Service: Orthopedics;  Laterality: Right;  CLOSED REDUCTION PINNING RT SMALL METACARPAL FX.   Family History: History reviewed. No pertinent family history. Social History:  History  Alcohol Use  . Yes    Comment: "couple times a week" "1-6 beers" last drink was on Tuesday. 06/07/14     History  Drug Use  . Yes  . Special: Marijuana    History   Social History  . Marital Status: Single    Spouse Name: N/A  .  Number of Children: N/A  . Years of Education: N/A   Social History Main Topics  . Smoking status: Current Every Day Smoker -- 1.00 packs/day for 6 years    Types: Cigarettes  . Smokeless tobacco: Never Used  . Alcohol Use: Yes     Comment: "couple times a week" "1-6 beers" last drink was on Tuesday. 06/07/14  . Drug Use: Yes    Special: Marijuana  . Sexual Activity: No   Other Topics Concern  . None   Social History Narrative   Additional Social History:  Musculoskeletal: Strength & Muscle Tone: within normal limits Gait & Station:  normal Patient leans: N/A  Psychiatric Specialty Exam: Physical Exam  Vitals reviewed.   Review of Systems  Psychiatric/Behavioral: Depression: rates 6/10.  All other systems reviewed and are negative.   Blood pressure 136/71, pulse 70, temperature 97.6 F (36.4 C), temperature source Oral, resp. rate 16, height _0  (1.676 m), weight 53.978 kg (119 lb).Body mass index is 19.22 kg/(m^2).  General Appearance: Casual  Eye Contact::  Fair  Speech:  Normal Rate  Volume:  Normal  Mood:  Depressed  Affect:  Appropriate  Thought Process:  Coherent  Orientation:  Full (Time, Place, and Person)  Thought Content:  Rumination  Suicidal Thoughts:  No  Homicidal Thoughts:  No  Memory:  Immediate;   Fair Recent;   Fair Remote;   Fair  Judgement:  Fair  Insight:  Fair  Psychomotor Activity:  Normal  Concentration:  Good  Recall:  Good  Fund of Knowledge:Good  Language: Fair  Akathisia:  Negative  Handed:  Right  AIMS (if indicated):     Assets:  Desire for Improvement Physical Health Resilience  ADL's:  Intact  Cognition: WNL  Sleep:      Risk to Self: Is patient at risk for suicide?: Yes Risk to Others:   Prior Inpatient Therapy:   Prior Outpatient Therapy:    Alcohol Screening: 1. How often do you have a drink containing alcohol?: 4 or more times a week 2. How many drinks containing alcohol do you have on a typical day when you are drinking?: 5 or 6 3. How often do you have six or more drinks on one occasion?: Weekly Preliminary Score: 5 4. How often during the last year have you found that you were not able to stop drinking once you had started?: Never 5. How often during the last year have you failed to do what was normally expected from you becasue of drinking?: Never 6. How often during the last year have you needed a first drink in the morning to get yourself going after a heavy drinking session?: Never 7. How often during the last year have you had a feeling of guilt  of remorse after drinking?: Less than monthly 8. How often during the last year have you been unable to remember what happened the night before because you had been drinking?: Never 9. Have you or someone else been injured as a result of your drinking?: No 10. Has a relative or friend or a doctor or another health worker been concerned about your drinking or suggested you cut down?: No Alcohol Use Disorder Identification Test Final Score (AUDIT): 10 Brief Intervention: Yes  Allergies:  No Known Allergies Lab Results:  Results for orders placed or performed during the hospital encounter of 06/06/14 (from the past 48 hour(s))  Acetaminophen level     Status: Abnormal   Collection Time: 06/06/14 10:33 PM  Result Value Ref Range   Acetaminophen (Tylenol), Serum <10.0 (L) 10 - 30 ug/mL    Comment:        THERAPEUTIC CONCENTRATIONS VARY SIGNIFICANTLY. A RANGE OF 10-30 ug/mL MAY BE AN EFFECTIVE CONCENTRATION FOR MANY PATIENTS. HOWEVER, SOME ARE BEST TREATED AT CONCENTRATIONS OUTSIDE THIS RANGE. ACETAMINOPHEN CONCENTRATIONS >150 ug/mL AT 4 HOURS AFTER INGESTION AND >50 ug/mL AT 12 HOURS AFTER INGESTION ARE OFTEN ASSOCIATED WITH TOXIC REACTIONS.   CBC     Status: None   Collection Time: 06/06/14 10:33 PM  Result Value Ref Range   WBC 8.9 4.0 - 10.5 K/uL   RBC 5.38 4.22 - 5.81 MIL/uL   Hemoglobin 16.2 13.0 - 17.0 g/dL   HCT 48.2 39.0 - 52.0 %   MCV 89.6 78.0 - 100.0 fL   MCH 30.1 26.0 - 34.0 pg   MCHC 33.6 30.0 - 36.0 g/dL   RDW 12.8 11.5 - 15.5 %   Platelets 299 150 - 400 K/uL  Comprehensive metabolic panel     Status: None   Collection Time: 06/06/14 10:33 PM  Result Value Ref Range   Sodium 140 135 - 145 mmol/L   Potassium 3.7 3.5 - 5.1 mmol/L   Chloride 103 96 - 112 mmol/L   CO2 26 19 - 32 mmol/L   Glucose, Bld 88 70 - 99 mg/dL   BUN 8 6 - 23 mg/dL   Creatinine, Ser 0.91 0.50 - 1.35 mg/dL   Calcium 9.8 8.4 - 10.5 mg/dL   Total Protein 7.7 6.0 - 8.3 g/dL   Albumin 5.0  3.5 - 5.2 g/dL   AST 16 0 - 37 U/L   ALT 13 0 - 53 U/L   Alkaline Phosphatase 42 39 - 117 U/L   Total Bilirubin 0.6 0.3 - 1.2 mg/dL   GFR calc non Af Amer >90 >90 mL/min   GFR calc Af Amer >90 >90 mL/min    Comment: (NOTE) The eGFR has been calculated using the CKD EPI equation. This calculation has not been validated in all clinical situations. eGFR's persistently <90 mL/min signify possible Chronic Kidney Disease.    Anion gap 11 5 - 15  Ethanol (ETOH)     Status: Abnormal   Collection Time: 06/06/14 10:33 PM  Result Value Ref Range   Alcohol, Ethyl (B) 20 (H) 0 - 9 mg/dL    Comment:        LOWEST DETECTABLE LIMIT FOR SERUM ALCOHOL IS 11 mg/dL FOR MEDICAL PURPOSES ONLY   Salicylate level     Status: None   Collection Time: 06/06/14 10:33 PM  Result Value Ref Range   Salicylate Lvl <4.9 2.8 - 20.0 mg/dL  Urine Drug Screen     Status: Abnormal   Collection Time: 06/06/14 10:47 PM  Result Value Ref Range   Opiates NONE DETECTED NONE DETECTED   Cocaine NONE DETECTED NONE DETECTED   Benzodiazepines NONE DETECTED NONE DETECTED   Amphetamines NONE DETECTED NONE DETECTED   Tetrahydrocannabinol POSITIVE (A) NONE DETECTED   Barbiturates NONE DETECTED NONE DETECTED    Comment:        DRUG SCREEN FOR MEDICAL PURPOSES ONLY.  IF CONFIRMATION IS NEEDED FOR ANY PURPOSE, NOTIFY LAB WITHIN 5 DAYS.        LOWEST DETECTABLE LIMITS FOR URINE DRUG SCREEN Drug Class       Cutoff (ng/mL) Amphetamine      1000 Barbiturate      200 Benzodiazepine   355 Tricyclics  300 Opiates          300 Cocaine          300 THC              50    Current Medications: Current Facility-Administered Medications  Medication Dose Route Frequency Provider Last Rate Last Dose  . acetaminophen (TYLENOL) tablet 650 mg  650 mg Oral Q6H PRN Laverle Hobby, PA-C      . alum & mag hydroxide-simeth (MAALOX/MYLANTA) 200-200-20 MG/5ML suspension 30 mL  30 mL Oral Q4H PRN Laverle Hobby, PA-C      .  gabapentin (NEURONTIN) capsule 100 mg  100 mg Oral [id number] Saramma Eappen, MD      . hydrOXYzine (ATARAX/VISTARIL) tablet 25 mg  25 mg Oral Q6H PRN Laverle Hobby, PA-C      . magnesium hydroxide (MILK OF MAGNESIA) suspension 30 mL  30 mL Oral Daily PRN Laverle Hobby, PA-C      . nicotine (NICODERM CQ - dosed in mg/24 hours) patch 21 mg  21 mg Transdermal Daily Kerrie Buffalo, NP      . traZODone (DESYREL) tablet 50 mg  50 mg Oral QHS,MR X 1 Spencer E Simon, PA-C      . venlafaxine XR (EFFEXOR-XR) 24 hr capsule 37.5 mg  37.5 mg Oral Q breakfast Saramma Eappen, MD       PTA Medications: Prescriptions prior to admission  Medication Sig Dispense Refill Last Dose  . FLUoxetine (PROZAC) 20 MG capsule Take 1 capsule (20 mg total) by mouth daily. (Patient not taking: Reported on 06/06/2014) 30 capsule 0     Previous Psychotropic Medications: Yes   Substance Abuse History in the last 12 months:  Yes.      Consequences of Substance Abuse: NA  Results for orders placed or performed during the hospital encounter of 06/06/14 (from the past 72 hour(s))  Acetaminophen level     Status: Abnormal   Collection Time: 06/06/14 10:33 PM  Result Value Ref Range   Acetaminophen (Tylenol), Serum <10.0 (L) 10 - 30 ug/mL    Comment:        THERAPEUTIC CONCENTRATIONS VARY SIGNIFICANTLY. A RANGE OF 10-30 ug/mL MAY BE AN EFFECTIVE CONCENTRATION FOR MANY PATIENTS. HOWEVER, SOME ARE BEST TREATED AT CONCENTRATIONS OUTSIDE THIS RANGE. ACETAMINOPHEN CONCENTRATIONS >150 ug/mL AT 4 HOURS AFTER INGESTION AND >50 ug/mL AT 12 HOURS AFTER INGESTION ARE OFTEN ASSOCIATED WITH TOXIC REACTIONS.   CBC     Status: None   Collection Time: 06/06/14 10:33 PM  Result Value Ref Range   WBC 8.9 4.0 - 10.5 K/uL   RBC 5.38 4.22 - 5.81 MIL/uL   Hemoglobin 16.2 13.0 - 17.0 g/dL   HCT 48.2 39.0 - 52.0 %   MCV 89.6 78.0 - 100.0 fL   MCH 30.1 26.0 - 34.0 pg   MCHC 33.6 30.0 - 36.0 g/dL   RDW 12.8 11.5 - 15.5 %    Platelets 299 150 - 400 K/uL  Comprehensive metabolic panel     Status: None   Collection Time: 06/06/14 10:33 PM  Result Value Ref Range   Sodium 140 135 - 145 mmol/L   Potassium 3.7 3.5 - 5.1 mmol/L   Chloride 103 96 - 112 mmol/L   CO2 26 19 - 32 mmol/L   Glucose, Bld 88 70 - 99 mg/dL   BUN 8 6 - 23 mg/dL   Creatinine, Ser 0.91 0.50 - 1.35 mg/dL   Calcium 9.8 8.4 - 10.5  mg/dL   Total Protein 7.7 6.0 - 8.3 g/dL   Albumin 5.0 3.5 - 5.2 g/dL   AST 16 0 - 37 U/L   ALT 13 0 - 53 U/L   Alkaline Phosphatase 42 39 - 117 U/L   Total Bilirubin 0.6 0.3 - 1.2 mg/dL   GFR calc non Af Amer >90 >90 mL/min   GFR calc Af Amer >90 >90 mL/min    Comment: (NOTE) The eGFR has been calculated using the CKD EPI equation. This calculation has not been validated in all clinical situations. eGFR's persistently <90 mL/min signify possible Chronic Kidney Disease.    Anion gap 11 5 - 15  Ethanol (ETOH)     Status: Abnormal   Collection Time: 06/06/14 10:33 PM  Result Value Ref Range   Alcohol, Ethyl (B) 20 (H) 0 - 9 mg/dL    Comment:        LOWEST DETECTABLE LIMIT FOR SERUM ALCOHOL IS 11 mg/dL FOR MEDICAL PURPOSES ONLY   Salicylate level     Status: None   Collection Time: 06/06/14 10:33 PM  Result Value Ref Range   Salicylate Lvl <2.0 2.8 - 20.0 mg/dL  Urine Drug Screen     Status: Abnormal   Collection Time: 06/06/14 10:47 PM  Result Value Ref Range   Opiates NONE DETECTED NONE DETECTED   Cocaine NONE DETECTED NONE DETECTED   Benzodiazepines NONE DETECTED NONE DETECTED   Amphetamines NONE DETECTED NONE DETECTED   Tetrahydrocannabinol POSITIVE (A) NONE DETECTED   Barbiturates NONE DETECTED NONE DETECTED    Comment:        DRUG SCREEN FOR MEDICAL PURPOSES ONLY.  IF CONFIRMATION IS NEEDED FOR ANY PURPOSE, NOTIFY LAB WITHIN 5 DAYS.        LOWEST DETECTABLE LIMITS FOR URINE DRUG SCREEN Drug Class       Cutoff (ng/mL) Amphetamine      1000 Barbiturate      200 Benzodiazepine    802 Tricyclics       233 Opiates          300 Cocaine          300 THC              50     Observation Level/Precautions:  15 minute checks  Laboratory:  CBC  Psychotherapy:  group  Medications:  As per medlist  Consultations:  As needed  Discharge Concerns:  safety   Estimated LOS:  5-7 days  Other:     Psychological Evaluations: Yes   Treatment Plan Summary:  Admit for crisis management and mood stabilization. Medication management to re-stabilize current mood symptoms, Effexor for depression.   Group counseling sessions for coping skills Medical consults as needed Review and reinstate any pertinent home medications for other health problems  Medical Decision Making:  New problem, with additional work up planned, Review of Psycho-Social Stressors (1), Decision to obtain old records (1), Review and summation of old records (2), Review of Last Therapy Session (1), Review of Medication Regimen & Side Effects (2) and Review of New Medication or Change in Dosage (2)  I certify that inpatient services furnished can reasonably be expected to improve the patient's condition.   Freda Munro May Kasen Adduci AGNP-BC 4/6/20162:11 PM

## 2014-06-07 NOTE — BH Assessment (Signed)
Inpt recommended. Seeking placement, and will have AC review for possible acceptance. [facility], [facility]   [place] [place] [place], [place] Triage Specialist 06/07/2014 2:09 AM

## 2014-06-07 NOTE — Progress Notes (Signed)
Pt stated that he is just really tired and wants to sleep.

## 2014-06-07 NOTE — [facility] Group Notes (Signed)
[facility]   06/07/2014 12:58 PM  Participation Quality:  Did not attend group.  Wynn BankerHodnett, Lenell Mcconnell Hairston  06/07/2014   12:59 PM

## 2014-06-08 NOTE — Progress Notes (Signed)
D: Patient continues to present with blunted affect and depressed mood; anxious at times. He reported on the self inventory sheet that he's sleeping poorly at night, appetite is fair, normal energy level and good ability to concentrate. He's rating depression "2", feelings of hopelessness "3" and anxiety "4". Writer observes that the patient is more interactive with peers in the dayroom today as opposed to the previous shift and participating in groups. Patient complies with the current medication regimen.  A: Support and encouragement provided to patient. Scheduled medications given per MD orders. Maintain Q15 minute checks for safety.  R: Patient receptive. Denies SI/HI and AVH. Patient remains safe on the hall.

## 2014-06-08 NOTE — [facility] Group Notes (Signed)
[facility] LCSW Group Therapy  [facility] 1:15 - 2:30 PM  06/08/2014 3:33 PM   Type of Therapy:  Group Therapy  Participation Level: Active  Participation Quality:  Attentive  Affect:  Appropriate  Cognitive:  Appropriate  Insight:  Developing/Improving   Engagement in Therapy:  Developing/Improving   Modes of Intervention:  Discussion, Education, Exploration, Problem-Solving, Rapport Building, Support   Summary of Progress/Problems:   Patient was attentive to speaker from the [employer] as he shared his story of dealing with mental health/substance abuse issues and overcoming it by working a recovery program. Patient thanked speaker and stated he would be following up with program at discharge.  Patient received information on their agency.    [name], [name] 06/08/2014 3:33 PM

## 2014-06-08 NOTE — [facility] Group Notes (Signed)
[facility] Group Notes:  (Nursing/MHT/Case Management/Adjunct)  Date:  06/08/2014  Time:  0900  Type of Therapy:  Nurse Education  Participation Level:  Active  Participation Quality:  Appropriate  Affect:  Appropriate  Cognitive:  Appropriate  Insight:  Appropriate  Engagement in Group:  Engaged  Modes of Intervention:  Discussion and Education  Summary of Progress/Problems:  [name] L 06/08/2014, 9:39 AM  

## 2014-06-08 NOTE — Progress Notes (Signed)
[facility]  06/08/2014 1:44 PM [name]  MRN:  [id number] Subjective:  Patient states " I am OK. "  Objective:Patient seen and chart reviewed.Patient discussed with treatment team. Pt presented after having SI with plan to shoot self with a gun. Pt today seems to be calmer , his anxiety as well as depression is improving. Pt has been able to set some goals for self.  Pt has been taking his medications . Discussed with him that medications takes atleast 6-8 weeks to have its full effect . Patient to give medications some time and let providers maximize his dose , if current dose is ineffective. Pt currently denies SI/HI/AH/VH. Pt has multiple superficial lacerations on his forearm , pt with hx of self mutilation . Pt to use his coping skills, attend groups . Pt denies ADRs of medications.       Principal Problem: Panic disorder Diagnosis:   Patient Active Problem List   Diagnosis Date Noted  . Panic disorder [F41.0] 06/07/2014  . Adjustment disorder with depressed mood [F43.21] 06/07/2014  . Cannabis use disorder, moderate, dependence [F12.20] 06/07/2014  . Tobacco use disorder [Z72.0] 06/07/2014   Total Time spent with patient: 30 minutes   Past Medical History:  Past Medical History  Diagnosis Date  . Psoriasis     right foot  . Asthma     triggered by cold weather; prn inhaler  . Anxiety     no current med.  . Fracture of metacarpal 02/13/2012    right small  . Depression     Past Surgical History  Procedure Laterality Date  . Tonsillectomy  age 24  . Closed reduction finger with percutaneous pinning  02/16/2012    Procedure: CLOSED REDUCTION FINGER WITH PERCUTANEOUS PINNING;  Surgeon: Marlowe ShoresMatthew A Weingold, MD;  Location: [facility];  Service: Orthopedics;  Laterality: Right;  CLOSED REDUCTION PINNING RT SMALL METACARPAL FX.   Family History: History reviewed. No pertinent family history. Social History:  History  Alcohol Use  . Yes   Comment: "couple times a week" "1-6 beers" last drink was on Tuesday. 06/07/14     History  Drug Use  . Yes  . Special: Marijuana    History   Social History  . Marital Status: Single    Spouse Name: N/A  . Number of Children: N/A  . Years of Education: N/A   Social History Main Topics  . Smoking status: Current Every Day Smoker -- 1.00 packs/day for 6 years    Types: Cigarettes  . Smokeless tobacco: Never Used  . Alcohol Use: Yes     Comment: "couple times a week" "1-6 beers" last drink was on Tuesday. 06/07/14  . Drug Use: Yes    Special: Marijuana  . Sexual Activity: No   Other Topics Concern  . None   Social History Narrative   Additional History:    Sleep: Fair  Appetite:  Fair   Musculoskeletal: Strength & Muscle Tone: within normal limits Gait & Station: normal Patient leans: N/A   Psychiatric Specialty Exam: Physical Exam  Review of Systems  Psychiatric/Behavioral: Positive for depression and substance abuse. The patient is nervous/anxious.     Blood pressure 118/67, pulse 89, temperature 98 F (36.7 C), temperature source Oral, resp. rate 20, height 5\' 6"  (1.676 m), weight 53.978 kg (119 lb).Body mass index is 19.22 kg/(m^2).  General Appearance: Casual  Eye Contact::  Good  Speech:  Clear and Coherent  Volume:  Normal  Mood:  Anxious and Depressed  Affect:  Congruent  Thought Process:  Linear  Orientation:  Full (Time, Place, and Person)  Thought Content:  Rumination  Suicidal Thoughts:  No  Homicidal Thoughts:  No  Memory:  Immediate;   Fair Recent;   Fair Remote;   Fair  Judgement:  Impaired  Insight:  Fair  Psychomotor Activity:  Normal  Concentration:  Fair  Recall:  [employer] of Knowledge:Fair  Language: Fair  Akathisia:  No  Handed:  Right  AIMS (if indicated):     Assets:  Communication Skills Desire for Improvement Physical Health Social Support  ADL's:  Intact  Cognition: WNL  Sleep:  Number of Hours: 4     Current  Medications: Current Facility-Administered Medications  Medication Dose Route Frequency Provider Last Rate Last Dose  . acetaminophen (TYLENOL) tablet 650 mg  650 mg Oral Q6H PRN Kerry Hough, PA-C      . alum & mag hydroxide-simeth (MAALOX/MYLANTA) 200-200-20 MG/5ML suspension 30 mL  30 mL Oral Q4H PRN Kerry Hough, PA-C      . gabapentin (NEURONTIN) capsule 100 mg  100 mg Oral [id number] Jomarie Longs, MD   100 mg at 06/08/14 0759  . hydrOXYzine (ATARAX/VISTARIL) tablet 25 mg  25 mg Oral Q6H PRN Kerry Hough, PA-C      . magnesium hydroxide (MILK OF MAGNESIA) suspension 30 mL  30 mL Oral Daily PRN Kerry Hough, PA-C      . nicotine (NICODERM CQ - dosed in mg/24 hours) patch 21 mg  21 mg Transdermal Daily Adonis Brook, NP   21 mg at 06/08/14 0759  . traZODone (DESYREL) tablet 50 mg  50 mg Oral QHS,MR X 1 Kerry Hough, PA-C   50 mg at 06/07/14 2140  . venlafaxine XR (EFFEXOR-XR) 24 hr capsule 37.5 mg  37.5 mg Oral Q breakfast Jomarie Longs, MD   37.5 mg at 06/08/14 4696    Lab Results:  Results for orders placed or performed during the hospital encounter of 06/07/14 (from the past 48 hour(s))  TSH     Status: None   Collection Time: 06/07/14  7:45 PM  Result Value Ref Range   TSH 0.476 0.350 - 4.500 uIU/mL    Comment: Performed at [facility]    Physical Findings: AIMS:  , ,  ,  ,    CIWA:  CIWA-Ar Total: 2 COWS:     Assessment: Patient is a 24 y old CM who presented with worsening SI with plan to shoot self. Pt reported having relational issues with girl friend with whom he has been staying since the past 1 month. Pt prior to that was incarcerated for 6 months. Pt continues to improve , is compliant on medications.     Treatment Plan Summary: Daily contact with patient to assess and evaluate symptoms and progress in treatment and Medication management Will continue Effexor XR 37.5 mg po daily for panic disorder. Will continue Gabapentin 100 mg  po tid for anxiety sx. Will continue Trazodone 50 mg po qhs for sleep. Patient to use coping techniques ,attend groups. CSW will work on disposition.   Medical Decision Making:  Review of Psycho-Social Stressors (1), Review or order clinical lab tests (1), Review and summation of old records (2), Review of Last Therapy Session (1), Review of Medication Regimen & Side Effects (2) and Review of New Medication or Change in Dosage (2)     Donald Jacque MD 06/08/2014, 1:44  PM

## 2014-06-08 NOTE — Progress Notes (Signed)
Pt attended karaoke group this evening.  

## 2014-06-08 NOTE — Progress Notes (Signed)
D: Pt reports feeling tired and not ready to fully participate within the milieu. Pt plans to be more active by attending more groups on day 2 of his admission. Pt was anxious in affect and mood. Pt is currently negative for any SI/HI/AVH. A: Writer provided pt with Suduko as a diversion activity for his anxiety. Pt also walked the halls. Pt's scheduled Neurontin was given as pt reports this med as helpful for his anxiety. Continued support and availability as needed was extended to this pt. Staff continue to monitor pt with q6415min checks.  R: No adverse drug reactions noted. Pt receptive to treatment. Pt remains safe at this time.

## 2014-06-09 NOTE — Progress Notes (Signed)
Recreation Therapy Notes  Date: 04.08.2016  Time: 9:30am Location: [address] Dayroom   Group Topic: Stress Management  Goal Area(s) Addresses:  Patient will actively participate in stress management techniques presented during session.   Behavioral Response: Engaged  Intervention: Stress management techniques  Activity :  Deep Breathing and Guided Imagery. LRT provided instruction and demonstration on practice of Guided Imagery. Technique was coupled with deep breathing.   Education:  Stress Management, Discharge Planning.   Education Outcome: Acknowledges education  Clinical Observations/Feedback: Patient actively engaged in stress management technique presented. Patient expressed no difficulties during session and identified ability to practice independently post d/c.    [name], LRT/CTRS  Jearl KlinefelterBlanchfield, Raven Furnas L 06/09/2014 6:29 PM

## 2014-06-09 NOTE — Progress Notes (Signed)
Patient up and visible in unit, wrapped in blanket. He is anxious but pleasant both in affect and mood. Minimal information forwarded but patient is cooperative. He rates his depression at a 1/10, hopelessness at a 2/10 and anxiety at a 3/10. States his goal is to avoid negative thought patterns by staying busy. Medicated per orders, support and reassurance given. He denies SI/HI and remains safe. Lawrence MarseillesFriedman, Oden Lindaman Eakes

## 2014-06-09 NOTE — Progress Notes (Signed)
[facility]  06/09/2014 2:05 PM [name]  MRN:  [id number] Subjective:  Patient states " I am feeling better.'  Objective:Patient seen and chart reviewed.Patient discussed with treatment team. Pt presented after having SI with plan to shoot self with a gun.  Pt is less anxious today , feels medications are working for him. Pt reports he is working on his coping skills . Pt with insight in to his mental illness as well as has been making plans for himself after discharge , so that he continues to improve. Pt currently denies SI/HI/AH/VH. Pt has multiple superficial lacerations on his forearm , pt with hx of self mutilation .  Pt with no disruptive issues noted on the unit . Pt denies ADRs of medications.       Principal Problem: Panic disorder Diagnosis:   Patient Active Problem List   Diagnosis Date Noted  . Panic disorder [F41.0] 06/07/2014  . Adjustment disorder with depressed mood [F43.21] 06/07/2014  . Cannabis use disorder, moderate, dependence [F12.20] 06/07/2014  . Tobacco use disorder [Z72.0] 06/07/2014   Total Time spent with patient: 30 minutes   Past Medical History:  Past Medical History  Diagnosis Date  . Psoriasis     right foot  . Asthma     triggered by cold weather; prn inhaler  . Anxiety     no current med.  . Fracture of metacarpal 02/13/2012    right small  . Depression     Past Surgical History  Procedure Laterality Date  . Tonsillectomy  age 89  . Closed reduction finger with percutaneous pinning  02/16/2012    Procedure: CLOSED REDUCTION FINGER WITH PERCUTANEOUS PINNING;  Surgeon: Marlowe ShoresMatthew A Weingold, MD;  Location: [facility];  Service: Orthopedics;  Laterality: Right;  CLOSED REDUCTION PINNING RT SMALL METACARPAL FX.   Family History: History reviewed. No pertinent family history. Social History:  History  Alcohol Use  . Yes    Comment: "couple times a week" "1-6 beers" last drink was on Tuesday. 06/07/14      History  Drug Use  . Yes  . Special: Marijuana    History   Social History  . Marital Status: Single    Spouse Name: N/A  . Number of Children: N/A  . Years of Education: N/A   Social History Main Topics  . Smoking status: Current Every Day Smoker -- 1.00 packs/day for 6 years    Types: Cigarettes  . Smokeless tobacco: Never Used  . Alcohol Use: Yes     Comment: "couple times a week" "1-6 beers" last drink was on Tuesday. 06/07/14  . Drug Use: Yes    Special: Marijuana  . Sexual Activity: No   Other Topics Concern  . None   Social History Narrative   Additional History:    Sleep: Fair  Appetite:  Fair   Musculoskeletal: Strength & Muscle Tone: within normal limits Gait & Station: normal Patient leans: N/A   Psychiatric Specialty Exam: Physical Exam  Review of Systems  Psychiatric/Behavioral: Positive for depression and substance abuse. The patient is nervous/anxious.     Blood pressure 123/69, pulse 61, temperature 97.6 F (36.4 C), temperature source Oral, resp. rate 16, height 5\' 6"  (1.676 m), weight 53.978 kg (119 lb).Body mass index is 19.22 kg/(m^2).  General Appearance: Casual  Eye Contact::  Good  Speech:  Clear and Coherent  Volume:  Normal  Mood:  Anxious and Depressed improving  Affect:  Congruent  Thought Process:  Linear  Orientation:  Full (Time, Place, and Person)  Thought Content:  Rumination  Suicidal Thoughts:  No  Homicidal Thoughts:  No  Memory:  Immediate;   Fair Recent;   Fair Remote;   Fair  Judgement:  Fair  Insight:  Fair  Psychomotor Activity:  Normal  Concentration:  Fair  Recall:  [employer] of Knowledge:Fair  Language: Fair  Akathisia:  No  Handed:  Right  AIMS (if indicated):     Assets:  Communication Skills Desire for Improvement Physical Health Social Support  ADL's:  Intact  Cognition: WNL  Sleep:  Number of Hours: 6.5     Current Medications: Current Facility-Administered Medications  Medication Dose  Route Frequency Provider Last Rate Last Dose  . acetaminophen (TYLENOL) tablet 650 mg  650 mg Oral Q6H PRN Kerry Hough, PA-C      . alum & mag hydroxide-simeth (MAALOX/MYLANTA) 200-200-20 MG/5ML suspension 30 mL  30 mL Oral Q4H PRN Kerry Hough, PA-C      . gabapentin (NEURONTIN) capsule 100 mg  100 mg Oral BH-[id number] Jomarie Longs, MD   100 mg at 06/09/14 0808  . hydrOXYzine (ATARAX/VISTARIL) tablet 25 mg  25 mg Oral Q6H PRN Kerry Hough, PA-C      . magnesium hydroxide (MILK OF MAGNESIA) suspension 30 mL  30 mL Oral Daily PRN Kerry Hough, PA-C      . nicotine (NICODERM CQ - dosed in mg/24 hours) patch 21 mg  21 mg Transdermal Daily Adonis Brook, NP   21 mg at 06/09/14 0808  . traZODone (DESYREL) tablet 50 mg  50 mg Oral QHS,MR X 1 Kerry Hough, PA-C   50 mg at 06/08/14 2156  . venlafaxine XR (EFFEXOR-XR) 24 hr capsule 37.5 mg  37.5 mg Oral Q breakfast Jomarie Longs, MD   37.5 mg at 06/09/14 0808    Lab Results:  Results for orders placed or performed during the hospital encounter of 06/07/14 (from the past 48 hour(s))  TSH     Status: None   Collection Time: 06/07/14  7:45 PM  Result Value Ref Range   TSH 0.476 0.350 - 4.500 uIU/mL    Comment: Performed at [facility]    Physical Findings: AIMS:  , ,  ,  ,    CIWA:  CIWA-Ar Total: 2 COWS:     Assessment: Patient is a 24 y old CM who presented with worsening SI with plan to shoot self. Pt reported having relational issues with girl friend with whom he has been staying since the past 1 month. Pt prior to that was incarcerated for 6 months. Pt continues to improve , continues to work on coping skills , is compliant on medications.     Treatment Plan Summary: Daily contact with patient to assess and evaluate symptoms and progress in treatment and Medication management Will continue Effexor XR 37.5 mg po daily for panic disorder. Will continue Gabapentin 100 mg po tid for anxiety sx. Will  continue Trazodone 50 mg po qhs for sleep. Patient to use coping techniques ,attend groups. CSW will work on disposition. Patient to be discharged on Saturday - 06/10/14  Medical Decision Making:  Review of Psycho-Social Stressors (1), Review of Last Therapy Session (1) and Review of Medication Regimen & Side Effects (2)     Kelvis Berger MD 06/09/2014, 2:05 PM

## 2014-06-09 NOTE — Progress Notes (Signed)
  [facility] :  Will you be returning to the same living situation after discharge:  No. Patient will be staying with friends. At discharge, do you have transportation home?: Yes,  Patient will arrange transportation home. Do you have the ability to pay for your medications: No.  Patient needs assistance with indigent medications   Release of information consent forms completed and in the chart;  Patient's signature needed at discharge.  Patient to Follow up at: Follow-up Information    Follow up with [facility] On 06/19/2014.   Why:  You are scheduled with Aquilla Hackerraci Anthony on Monday, June 19, 2014 at 1 PM   Contact information:   [address] [place], [place]   [address]  [phone]      [facility]2014.   Why:  Please go to [facility] walk [facility] clinic on Wednesday, June 14, 2014 or any weekday between 8AM - 3PM for medication   Contact information:   [address] [place], [place] [address]  [phone]      Patient denies SI/HI:Patient no longer endorsing SI/HI or other thoughts of self harm.   Safety Planning and Suicide Prevention discussed: .Reviewed with all patients during discharge planning group   Have you used any form of tobacco in the last 30 days? (Cigarettes, Smokeless Tobacco, Cigars, and/or Pipes): Yes  Has patient been referred to the Quitline?:  Quitline referral made on 06/09/14.  Wynn BankerHodnett, Bronda Alfred Hairston 06/09/2014, 3:20 PM

## 2014-06-09 NOTE — Progress Notes (Signed)
[facility] Group Notes:  (Nursing/MHT/Case Management/Adjunct)  Date:  06/09/2014  Time:  11:53 PM  Type of Therapy:  Group Therapy  Participation Level:  Active  Participation Quality:  Appropriate  Affect:  Appropriate  Cognitive:  Appropriate  Insight:  Appropriate  Engagement in Group:  Engaged  Modes of Intervention:  Support  Summary of Progress/Problems: Pt. Was engaged in group discussion on relapse prevention.  Pt. Stated his triggers were depression on lost of jobs.  Pt. Stated he felt he would bring troubles to himself by not going to work.  Pt. Stated he would keep himself busy with positive activities.  Sondra ComeWilson, Zulema Pulaski J 06/09/2014, 11:53 PM

## 2014-06-09 NOTE — Plan of Care (Signed)
Problem: Alteration in mood Goal: STG-Patient reports thoughts of self-harm to staff Outcome: Progressing Patient denies SI  Problem: Diagnosis: Increased Risk For Suicide Attempt Goal: STG-Patient Will Comply With Medication Regime Outcome: Progressing Patient has been med compliant and receptive to med education.

## 2014-06-09 NOTE — Clinical Social Work Note (Signed)
CSW spoke with [name], [phone].  [name] was notified that prior to admission, patient had considered using her gun to harm himself.  Dalton. Carlena Anthony agreed to secure weapon from patient.

## 2014-06-09 NOTE — [facility] Group Notes (Signed)
[facility]   06/09/2014 10:08 AM    Participation Quality:  Appropraite  Mood/Affect:  Appropriate  Depression Rating:  0  Anxiety Rating:  0  Thoughts of Suicide:  No  Will you contract for safety?   NA  Current AVH:  No  Plan for Discharge/Comments:  Patient attended discharge planning group and actively participated in group. He looks forward to discharging home today.  [facility]  Suicide prevention education reviewed and SPE document provided.   Transportation Means: Patient has transportation.   Supports:  Patient has a support system.   Marcelle Bebout, Joesph JulyQuylle Hairston

## 2014-06-09 NOTE — Tx Team (Signed)
Interdisciplinary Treatment Plan Update   Date Reviewed:  06/09/2014  Time Reviewed:  8:53 AM  Progress in Treatment:   Attending groups: Yes, patient is attending groups. Participating in groups: Yes, engages in group discussion. Taking medication as prescribed: Yes  Tolerating medication: Yes Family/Significant other contact made:  No, declined collateral contact Patient understands diagnosis: Yes, patient understands diagnosis and need for treatment. Discussing patient identified problems/goals with staff: Yes, patient is able to express goals for treatment and discharge. Medical problems stabilized or resolved: Yes Denies suicidal/homicidal ideation: Yes Patient has not harmed self or others: Yes  For review of initial/current patient goals, please see plan of care.  Estimated Length of Stay:  One day - Saturday  Reasons for Continued Hospitalization:   Medication stabilization   New Problems/Goals identified:    Discharge Plan or Barriers:   Home with outpatient [facility]  Additional Comments:  Patient and CSW reviewed patient's identified goals and treatment plan.  Patient verbalized understanding and agreed to treatment plan.   Attendees:  Patient:  06/09/2014 8:53 AM   Signature:  Sallyanne HaversF. Cobos, MD 06/09/2014 8:53 AM  Signature: Geoffery LyonsIrving Lugo, MD 06/09/2014 8:53 AM  Signature:  Merian CapronMarian Friedman, RN 06/09/2014 8:53 AM  Signature: Lennart PallKimberly Okofor, RN 06/09/2014 8:53 AM  Signature:   06/09/2014 8:53 AM  Signature:  Juline PatchQuylle Tatiyana Foucher, LCSW 06/09/2014 8:53 AM  Signature:  Belenda CruiseKristin Drinkard, LCSW-A 06/09/2014 8:53 AM  Signature:   06/09/2014 8:53 AM  Signature:   06/09/2014 8:53 AM  Signature:  06/09/2014  8:53 AM  Signature:   Onnie BoerJennifer Clark, RN [facility] 06/09/2014  8:53 AM  Signature:   06/09/2014  8:53 AM    Scribe for Treatment Team:   Juline PatchQuylle Arabia Nylund,  06/09/2014 8:53 AM

## 2014-06-09 NOTE — Progress Notes (Signed)
D)   Has been out and about on the hall this evening and in dayroom, laughing and interacting appropriately with staff and peers.  Asked if he had had visit from his family, stated had no visits today, but it had been a good day.  Went to [employer]araoke and stated he sang and had never done that before, enjoyed it, and enjoyed the praise and support he received from staff and peers, was smiling and pleased he had been able to have enough nerve to do it.  Has been compliant with meds, states feeling better tonight, denies thoughts of self harm.Marland Kitchen. A)   Was given support and praise for participating, will continue to monitor for safety, continue POC R)  Safety maintained.

## 2014-06-10 ENCOUNTER — Encounter (HOSPITAL_COMMUNITY): Payer: Self-pay | Admitting: Nurse Practitioner

## 2014-06-10 MED ORDER — GABAPENTIN 100 MG PO CAPS: 100.0000 mg | ORAL_CAPSULE | ORAL | Status: DC

## 2014-06-10 MED ORDER — HYDROXYZINE HCL 25 MG PO TABS: 25.0000 mg | ORAL_TABLET | Freq: Four times a day (QID) | ORAL | Status: DC | PRN

## 2014-06-10 MED ORDER — TRAZODONE HCL 50 MG PO TABS: 50.0000 mg | ORAL_TABLET | Freq: Every evening | ORAL | Status: DC | PRN

## 2014-06-10 MED ORDER — VENLAFAXINE HCL ER 37.5 MG PO CP24: 37.5000 mg | ORAL_CAPSULE | Freq: Every day | ORAL | Status: DC

## 2014-06-10 NOTE — Progress Notes (Signed)
Patient ID: [name], male   DOB: 1990-03-09, 24 y.o.   MRN: [id number] Patient discharged per MD orders. Patient given education regarding follow-up appointments and medications. Patient denies any questions or concerns about these instructions. Patient was escorted to locker and given belongings before discharge to hospital lobby. Patient currently denies SI/HI and auditory and visual hallucinations on discharge.

## 2014-06-10 NOTE — Progress Notes (Signed)
D)  Has been out and about on the hall this evening, seems more anxious this evening but pleasant and cooperative, is wrapped in a blanket.  Attended group, has been compliant with meds, was ready to go to bed shortly after snack, voices no c/o's denies thoughts of self harm. A)  Will continue to monitor for safety, continue POC R)  Remains safe on unit.

## 2014-06-10 NOTE — [facility] Group Notes (Signed)
[facility] Group Notes:  (Nursing/MHT/Case Management/Adjunct)  Date:  06/10/2014  Time:  10:07 AM  Type of Therapy:  Nurse Education  Participation Level:  Active  Participation Quality:  Appropriate, Sharing and Supportive  Affect:  Appropriate  Cognitive:  Alert and Appropriate  Insight:  Good  Engagement in Group:  Engaged  Modes of Intervention:  Education, Exploration, Problem-solving and Support  Summary of Progress/Problems: Insightful. Excellent participation in group.  Loren RacerMaggio, Asusena Sigley J 06/10/2014, 10:07 AM

## 2014-06-10 NOTE — [facility] Suicide Risk Assessment (Signed)
[facility]   Demographic Factors:  Adolescent or young adult  Total Time spent with patient: 45 minutes  Musculoskeletal: Strength & Muscle Tone: within normal limits Gait & Station: normal Patient leans: N/A  Psychiatric Specialty Exam: Physical Exam  Constitutional: He appears well-developed and well-nourished. No distress.  Skin: He is not diaphoretic.    Review of Systems  Cardiovascular: Negative for chest pain.  Skin: Negative for rash.  Psychiatric/Behavioral: Negative for depression. The patient is not nervous/anxious.     Blood pressure 128/71, pulse 81, temperature 98.1 F (36.7 C), temperature source Oral, resp. rate 18, height 5\' 6"  (1.676 m), weight 53.978 kg (119 lb).Body mass index is 19.22 kg/(m^2).  General Appearance: Casual  Eye Contact::  Fair  Speech:  Slow409  Volume:  Decreased  Mood:  Euthymic  Affect:  Constricted  Thought Process:  Coherent  Orientation:  Full (Time, Place, and Person)  Thought Content:  Rumination  Suicidal Thoughts:  No  Homicidal Thoughts:  No  Memory:  Immediate;   Fair Recent;   Fair  Judgement:  Fair  Insight:  Fair  Psychomotor Activity:  Normal  Concentration:  Fair  Recall:  [employer]Fair  Fund of Knowledge:Fair  Language: Fair  Akathisia:  Negative  Handed:  Right  AIMS (if indicated):     Assets:  Communication Skills Desire for Improvement Financial Resources/Insurance Housing Social Support  Sleep:  Number of Hours: 6  Cognition: WNL  ADL's:  Intact   Have you used any form of tobacco in the last 30 days? (Cigarettes, Smokeless Tobacco, Cigars, and/or Pipes): Yes  Has this patient used any form of tobacco in the last 30 days? (Cigarettes, Smokeless Tobacco, Cigars, and/or Pipes)   Mental Status Per Nursing Assessment::   On Admission:  Suicidal ideation indicated by patient  Current Mental Status by Physician: see MSE  Loss Factors: Decrease in vocational status and Loss of  significant relationship  Historical Factors: Impulsivity  Risk Reduction Factors:   Positive social support and Positive coping skills or problem solving skills  Continued Clinical Symptoms:  Dysthymia Previous Psychiatric Diagnoses and Treatments  Cognitive Features That Contribute To Risk:  Closed-mindedness    Suicide Risk:  Minimal: No identifiable suicidal ideation.  Patients presenting with no risk factors but with morbid ruminations; may be classified as minimal risk based on the severity of the depressive symptoms  Principal Problem: Panic disorder Discharge Diagnoses:  Patient Active Problem List   Diagnosis Date Noted  . Panic disorder [F41.0] 06/07/2014  . Adjustment disorder with depressed mood [F43.21] 06/07/2014  . Cannabis use disorder, moderate, dependence [F12.20] 06/07/2014  . Tobacco use disorder [Z72.0] 06/07/2014    Follow-up Information    Follow up with L[facility]etracy [facility] On 06/19/2014.   Why:  You are scheduled with Aquilla Hackerraci Anthony on Monday, June 19, 2014 at 1 PM   Contact information:   [address] [place], [place]   [address]  [phone]      [facility] On 06/14/2014.   Why:  Please go to [facility]'s walk in clinic on Wednesday, June 14, 2014 or any weekday between 8AM - 3PM for medication   Contact information:   [address] [place], [place] [address]  [phone]      Plan Of Care/Follow-up recommendations:  Activity:  as tolerated Diet:  regular  Is patient on multiple antipsychotic therapies at discharge:  No   Has Patient had three or  more failed trials of antipsychotic monotherapy by history:  No  Recommended Plan for Multiple Antipsychotic Therapies: NA    Caralyn Twining 06/10/2014, 10:09 AM

## 2014-06-10 NOTE — Discharge Summary (Signed)
Physician Discharge Summary Note  Patient:  [name] is an 24 y.o., male MRN:  [id number] DOB:  [birth year] Patient phone:  [phone] (home)  Patient address:   [address] [place] [address],  Total Time spent with patient: 30 minutes  Date of Admission:  06/07/2014 Date of Discharge: 06/10/2014  Reason for Admission:  Suicidal ideation  Principal Problem: Panic disorder Discharge Diagnoses: Patient Active Problem List   Diagnosis Date Noted  . Panic disorder [F41.0] 06/07/2014  . Adjustment disorder with depressed mood [F43.21] 06/07/2014  . Cannabis use disorder, moderate, dependence [F12.20] 06/07/2014  . Tobacco use disorder [Z72.0] 06/07/2014    Musculoskeletal: Strength & Muscle Tone: within normal limits Gait & Station: normal Patient leans: N/A  Psychiatric Specialty Exam:  SEE SRA Physical Exam  Vitals reviewed.   Review of Systems  Psychiatric/Behavioral: Depression: rates 3/10.  All other systems reviewed and are negative.   Blood pressure 128/71, pulse 81, temperature 98.1 F (36.7 C), temperature source Oral, resp. rate 18, height  (1.676 m), weight 53.978 kg (119 lb).Body mass index is 19.22 kg/(m^2).   Past Medical History:  Past Medical History  Diagnosis Date  . Psoriasis     right foot  . Asthma     triggered by cold weather; prn inhaler  . Anxiety     no current med.  . Fracture of metacarpal 02/13/2012    right small  . Depression     Past Surgical History  Procedure Laterality Date  . Tonsillectomy  age 31  . Closed reduction finger with percutaneous pinning  02/16/2012    Procedure: CLOSED REDUCTION FINGER WITH PERCUTANEOUS PINNING;  Surgeon: Marlowe Shores, MD;  Location: [facility];  Service: Orthopedics;  Laterality: Right;  CLOSED REDUCTION PINNING RT SMALL METACARPAL FX.   Family History: History reviewed. No pertinent family history. Social History:  History  Alcohol Use  . Yes     Comment: "couple times a week" "1-6 beers" last drink was on Tuesday. 06/07/14     History  Drug Use  . Yes  . Special: Marijuana    History   Social History  . Marital Status: Single    Spouse Name: N/A  . Number of Children: N/A  . Years of Education: N/A   Social History Main Topics  . Smoking status: Current Every Day Smoker -- 1.00 packs/day for 6 years    Types: Cigarettes  . Smokeless tobacco: Never Used  . Alcohol Use: Yes     Comment: "couple times a week" "1-6 beers" last drink was on Tuesday. 06/07/14  . Drug Use: Yes    Special: Marijuana  . Sexual Activity: No   Other Topics Concern  . None   Social History Narrative   Risk to Self: Is patient at risk for suicide?: Yes What has been your use of drugs/alcohol within the last 12 months?: Patient reports an ocassional drink Risk to Others:   Prior Inpatient Therapy:   Prior Outpatient Therapy:    Level of Care:  OP  Hospital Course:   [name] is a 24 y.o. male initially presenting to [facility] due to suicidal ideation with plan and intent. Per report, [name] was reportedly walking around his house with a gun for approximately an hour contemplating suicide prior to coming in to the ED. He stated that it was due to the relationship he has with the mother of his children. "She takes me to another level. She  is bad for me." He reported not feeling angry that he didn't pull the trigger as he thought anout his children and did nt have to nerve to go through with it.   Patient was seen today. He appears to be in a depressed mood but with fair eye-contact. His thought pattern is coherent and relevant with no evidence of delusional thought content. Pt does not appear to be responding to internal stimuli. Pt states that he is under increased stress at the moment because he is living with the mother of his children who is reportedly cheating on him. Pt came into ED voluntarily but was IVC'ed by EDP due to his level of  danger to self. Pt was last [facility] at [facility] in July and Sept of 2014 for SI. Pt reports that his children are his primary deterrent for not having yet committed suicide. Pt acknowledges drinking a 6-pk of alcohol 4-5 days out of the week. He also endorses marijuana use. Pt denies A/VH. He denies HI and hx of any type of abuse.  [name] was admitted for Panic disorder and crisis management.  He was treated discharged with the medications listed below under Medication List.  Medical problems were identified and treated as needed.  Home medications were restarted as appropriate.  Improvement was monitored by observation and [name] daily report of symptom reduction.  Emotional and mental status was monitored by daily self-inventory reports completed by [name] and clinical staff.         [name] was evaluated by the treatment team for stability and plans for continued recovery upon discharge.  [name] motivation was an integral factor for scheduling further treatment.  Employment, transportation, bed availability, health status, family support, and any pending legal issues were also considered during his hospital stay.  He was offered further treatment options upon discharge including but not limited to Residential, Intensive Outpatient, and Outpatient treatment.  [name] will follow up with the services as listed below under Follow Up Information.     Upon completion of this admission the patient was both mentally and medically stable for discharge denying suicidal/homicidal ideation, auditory/visual/tactile hallucinations, delusional thoughts and paranoia.       Consults:  psychiatry  Significant Diagnostic Studies:  labs: per ED  Discharge Vitals:   Blood pressure 128/71, pulse 81, temperature 98.1 F (36.7 C), temperature source Oral, resp. rate 18, height  (1.676 m), weight 53.978 kg (119 lb). Body mass index is 19.22 kg/(m^2). Lab Results:    Results for orders placed or performed during the hospital encounter of 06/07/14 (from the past 72 hour(s))  TSH     Status: None   Collection Time: 06/07/14  7:45 PM  Result Value Ref Range   TSH 0.476 0.350 - 4.500 uIU/mL    Comment: Performed at [facility]    Physical Findings: AIMS:  , ,  ,  ,    CIWA:  CIWA-Ar Total: 2 COWS:      See Psychiatric Specialty Exam and Suicide Risk Assessment completed by Attending Physician prior to discharge.  Discharge destination:  Home  Is patient on multiple antipsychotic therapies at discharge:  No   Has Patient had three or more failed trials of antipsychotic monotherapy by history:  No    Recommended Plan for Multiple Antipsychotic Therapies: NA     Medication List    STOP taking these medications  FLUoxetine 20 MG capsule  Commonly known as:  PROZAC      TAKE these medications      Indication   gabapentin 100 MG capsule  Commonly known as:  NEURONTIN  Take 1 capsule (100 mg total) by mouth 3 (three) times daily at 8am, 2pm and bedtime.   Indication:  Agitation, Neuropathic Pain     hydrOXYzine 25 MG tablet  Commonly known as:  ATARAX/VISTARIL  Take 1 tablet (25 mg total) by mouth every 6 (six) hours as needed for anxiety.   Indication:  Anxiety Neurosis     traZODone 50 MG tablet  Commonly known as:  DESYREL  Take 1 tablet (50 mg total) by mouth at bedtime and may repeat dose one time if needed.   Indication:  Trouble Sleeping     venlafaxine XR 37.5 MG 24 hr capsule  Commonly known as:  EFFEXOR-XR  Take 1 capsule (37.5 mg total) by mouth daily with breakfast.   Indication:  Major Depressive Disorder           Follow-up Information    Follow up with L[facility]lee [facility]etracy [facility]-   [facility] On 06/19/2014.   Why:  You are scheduled with Aquilla Hackerraci Anthony on Monday, June 19, 2014 at 1 PM   Contact information:   [address] [place], [place]   [address]  [phone]      [facility]  [facility] 06/14/2014.   Why:  Please go to [facility]'s walk in clinic on Wednesday, June 14, 2014 or any weekday between 8AM - 3PM for medication   Contact information:   [address] [place], [place] [address]  [phone]      Follow-up recommendations:  Activity:  as tol, diet as tol  Comments:  1.  Take all your medications as prescribed.              2.  Report any adverse side effects to outpatient provider.                       3.  Patient instructed to not use alcohol or illegal drugs while on prescription medicines.            4.  In the event of worsening symptoms, instructed patient to call 911, the crisis hotline or go to nearest emergency room for evaluation of symptoms.  Total Discharge Time:  30 min  Signed: Velna HatchetSheila May Agustin AGNP-BC 06/10/2014, 1:17 PM  I have examined the patient and agree with the discharge plan and findings. I have also done suicide assessment on this patient.

## 2014-06-10 NOTE — [facility] Group Notes (Signed)
[facility] Group Notes:  (Clinical Social Work)  06/10/2014     10-11AM  Summary of Progress/Problems:   The main focus of today's process group was to discuss healthy and unhealthy coping skills used by patients, in the process of which patients learned how to use a decisional balance exercise.  Motivational Interviewing and a worksheet were utilized to help patients explore in depth the perceived benefits and costs of a self-sabotaging behavior, as well as the  benefits and costs of replacing that with a healthy coping mechanism.   The patient expressed that he uses alcohol as an unhealthy coping mechanism, and that he has suffered many negative consequences as a result.  He also reported isolation and self-mutilation as things he has done to escape and to deal with his psychological pain.   He stated that in order to remain well he is going to have to do some uncomfortable things including not returning to live with the mother of his child because she triggers him to drink.  One healthy coping skill he discussed using was talking, i.e. In support groups, about issues and realizing that one is not alone.  He [facility] "[facility]" [facility] and wants to go to AA/support groups while at home to continue to feel this level of sameness.  Type of Therapy:  Group Therapy - Process   Participation Level:  Active  Participation Quality:  Appropriate, Attentive and Sharing  Affect:  Blunted  Cognitive:  Appropriate and Oriented  Insight:  Engaged  Engagement in Therapy:  Engaged  Modes of Intervention:  Education, Motivational Interviewing  Ambrose MantleMareida Grossman-Orr, LCSW 06/10/2014, 12:27 PM

## 2014-06-14 NOTE — Progress Notes (Signed)
Patient Discharge Instructions:  After Visit Summary (AVS):   Faxed to:  06/14/14 Discharge Summary Note:   Faxed to:  06/14/14 Psychiatric Admission Assessment Note:   Faxed to:  06/14/14 Suicide Risk Assessment - Discharge Assessment:   Faxed to:  06/14/14 Faxed/Sent to the Next Level Care provider:  06/14/14 Faxed to [facility] @ [phone] Faxed to [facility] @ [phone]  Jerelene ReddenSheena E [place], 06/14/2014, 3:59 PM

## 2015-01-31 ENCOUNTER — Encounter (HOSPITAL_COMMUNITY): Payer: Self-pay | Admitting: *Deleted

## 2015-01-31 ENCOUNTER — Emergency Department (HOSPITAL_COMMUNITY)
Admission: EM | Admit: 2015-01-31 | Discharge: 2015-01-31 | Disposition: A | Discharge status: Home / Self Care | Payer: Self-pay | Source: Home / Self Care | Attending: Family Medicine | Admitting: Family Medicine

## 2015-01-31 ENCOUNTER — Emergency Department (INDEPENDENT_AMBULATORY_CARE_PROVIDER_SITE_OTHER): Payer: Self-pay

## 2015-01-31 DIAGNOSIS — M7551 Bursitis of right shoulder: Secondary | ICD-10-CM

## 2015-01-31 MED ORDER — DICLOFENAC SODIUM 1 % TD GEL: 4.0000 g | Freq: Four times a day (QID) | TRANSDERMAL | Status: DC

## 2015-01-31 MED ORDER — CYCLOBENZAPRINE HCL 5 MG PO TABS: 5.0000 mg | ORAL_TABLET | Freq: Three times a day (TID) | ORAL | Status: DC | PRN

## 2015-01-31 NOTE — Discharge Instructions (Signed)
Heat and medicine gel as prescribed, see orthopedist if further problems

## 2015-01-31 NOTE — ED Notes (Signed)
Pt  Reports  Pain  r  Shoulder     And  r  Upper  Back       With  Symptoms   X  3    Weeks    Pain is  Worse  On movement  And  posistion      denys  Any  specefic       Injury       Pt   Has  A  History  Of    Psoriasis

## 2015-01-31 NOTE — ED Provider Notes (Signed)
CSN: [id number]     Arrival date & time 01/31/15  1300 History   First MD Initiated Contact with Patient 01/31/15 1347     Chief Complaint  Patient presents with  . Shoulder Pain   (Consider location/radiation/quality/duration/timing/severity/associated sxs/prior Treatment) Patient is a 24 y.o. male presenting with shoulder pain. The history is provided by the patient.  Shoulder Pain Location:  Shoulder Time since incident:  3 weeks Injury: no   Shoulder location:  R shoulder Pain details:    Quality:  Sharp   Radiates to:  Back   Severity:  Mild   Progression:  Unchanged Chronicity:  New Dislocation: no   Prior injury to area:  No Associated symptoms: decreased range of motion and stiffness   Associated symptoms: no back pain and no numbness     Past Medical History  Diagnosis Date  . Psoriasis     right foot  . Asthma     triggered by cold weather; prn inhaler  . Anxiety     no current med.  . Fracture of metacarpal 02/13/2012    right small  . Depression    Past Surgical History  Procedure Laterality Date  . Tonsillectomy  age 89  . Closed reduction finger with percutaneous pinning  02/16/2012    Procedure: CLOSED REDUCTION FINGER WITH PERCUTANEOUS PINNING;  Surgeon: Marlowe ShoresMatthew A Weingold, MD;  Location: [facility];  Service: Orthopedics;  Laterality: Right;  CLOSED REDUCTION PINNING RT SMALL METACARPAL FX.   History reviewed. No pertinent family history. Social History  Substance Use Topics  . Smoking status: Current Every Day Smoker -- 1.00 packs/day for 6 years    Types: Cigarettes  . Smokeless tobacco: Never Used  . Alcohol Use: Yes     Comment: "couple times a week" "1-6 beers" last drink was on Tuesday. 06/07/14    Review of Systems  Constitutional: Negative.   Musculoskeletal: Positive for stiffness. Negative for back pain, joint swelling and gait problem.  Skin: Negative.   All other systems reviewed and are negative.   Allergies   Review of patient's allergies indicates no known allergies.  Home Medications   Prior to Admission medications   Medication Sig Start Date End Date Taking? Authorizing Provider  cyclobenzaprine (FLEXERIL) 5 MG tablet Take 1 tablet (5 mg total) by mouth 3 (three) times daily as needed for muscle spasms. 01/31/15   Linna HoffJames D Anica Alcaraz, MD  diclofenac sodium (VOLTAREN) 1 % GEL Apply 4 g topically 4 (four) times daily. To right shoulder 01/31/15   Linna HoffJames D Rosalee Tolley, MD  gabapentin (NEURONTIN) 100 MG capsule Take 1 capsule (100 mg total) by mouth 3 (three) times daily at 8am, 2pm and bedtime. 06/10/14   Adonis BrookSheila Agustin, NP  hydrOXYzine (ATARAX/VISTARIL) 25 MG tablet Take 1 tablet (25 mg total) by mouth every 6 (six) hours as needed for anxiety. 06/10/14   Adonis BrookSheila Agustin, NP  traZODone (DESYREL) 50 MG tablet Take 1 tablet (50 mg total) by mouth at bedtime and may repeat dose one time if needed. 06/10/14   Adonis BrookSheila Agustin, NP  venlafaxine XR (EFFEXOR-XR) 37.5 MG 24 hr capsule Take 1 capsule (37.5 mg total) by mouth daily with breakfast. 06/10/14   Adonis BrookSheila Agustin, NP   Meds Ordered and Administered this Visit  Medications - No data to display  BP 125/70 mmHg  Pulse 55  Temp(Src) 98.3 F (36.8 C) (Oral)  Resp 16  SpO2 100% No data found.   Physical Exam  Constitutional: He is oriented  to person, place, and time. He appears well-developed and well-nourished.  Musculoskeletal: He exhibits tenderness.       Right shoulder: He exhibits decreased range of motion, tenderness, crepitus and pain. He exhibits no bony tenderness, no swelling, no deformity, normal pulse and normal strength.  Neurological: He is alert and oriented to person, place, and time.  Skin: Skin is warm and dry.  Nursing note and vitals reviewed.   ED Course  Procedures (including critical care time)  Labs Review Labs Reviewed - No data to display  Imaging Review Dg Shoulder Right  01/31/2015  CLINICAL DATA:  24 year old male with 3 week  history of right shoulder pain. No known injury. EXAM: RIGHT SHOULDER - 2+ VIEW COMPARISON:  None. FINDINGS: There is no evidence of fracture or dislocation. There is no evidence of arthropathy or other focal bone abnormality. Soft tissues are unremarkable. IMPRESSION: Negative. Electronically Signed   By: Malachy Moan M.D.   On: 01/31/2015 14:32   X-rays reviewed and report per radiologist.   Visual Acuity Review  Right Eye Distance:   Left Eye Distance:   Bilateral Distance:    Right Eye Near:   Left Eye Near:    Bilateral Near:         MDM   1. Shoulder bursitis, right        Linna Hoff, MD 01/31/15 [phone]

## 2015-05-14 ENCOUNTER — Emergency Department (HOSPITAL_COMMUNITY): Payer: Self-pay

## 2015-05-14 ENCOUNTER — Encounter (HOSPITAL_COMMUNITY): Payer: Self-pay | Admitting: Vascular Surgery

## 2015-05-14 ENCOUNTER — Emergency Department (HOSPITAL_COMMUNITY)
Admission: EM | Admit: 2015-05-14 | Discharge: 2015-05-14 | Disposition: A | Discharge status: Home / Self Care | Payer: Self-pay | Attending: Emergency Medicine | Admitting: Emergency Medicine

## 2015-05-14 DIAGNOSIS — Z872 Personal history of diseases of the skin and subcutaneous tissue: Secondary | ICD-10-CM | POA: Insufficient documentation

## 2015-05-14 DIAGNOSIS — Z8781 Personal history of (healed) traumatic fracture: Secondary | ICD-10-CM | POA: Insufficient documentation

## 2015-05-14 DIAGNOSIS — F419 Anxiety disorder, unspecified: Secondary | ICD-10-CM | POA: Insufficient documentation

## 2015-05-14 DIAGNOSIS — Y998 Other external cause status: Secondary | ICD-10-CM | POA: Insufficient documentation

## 2015-05-14 DIAGNOSIS — J45909 Unspecified asthma, uncomplicated: Secondary | ICD-10-CM | POA: Insufficient documentation

## 2015-05-14 DIAGNOSIS — Z791 Long term (current) use of non-steroidal anti-inflammatories (NSAID): Secondary | ICD-10-CM | POA: Insufficient documentation

## 2015-05-14 DIAGNOSIS — F17210 Nicotine dependence, cigarettes, uncomplicated: Secondary | ICD-10-CM | POA: Insufficient documentation

## 2015-05-14 DIAGNOSIS — Y9289 Other specified places as the place of occurrence of the external cause: Secondary | ICD-10-CM | POA: Insufficient documentation

## 2015-05-14 DIAGNOSIS — S29001A Unspecified injury of muscle and tendon of front wall of thorax, initial encounter: Secondary | ICD-10-CM | POA: Insufficient documentation

## 2015-05-14 DIAGNOSIS — Y9351 Activity, roller skating (inline) and skateboarding: Secondary | ICD-10-CM | POA: Insufficient documentation

## 2015-05-14 DIAGNOSIS — R0781 Pleurodynia: Secondary | ICD-10-CM

## 2015-05-14 DIAGNOSIS — F329 Major depressive disorder, single episode, unspecified: Secondary | ICD-10-CM | POA: Insufficient documentation

## 2015-05-14 DIAGNOSIS — Z79899 Other long term (current) drug therapy: Secondary | ICD-10-CM | POA: Insufficient documentation

## 2015-05-14 MED ORDER — OXYCODONE-ACETAMINOPHEN 5-325 MG PO TABS: 1.0000 | ORAL_TABLET | Freq: Three times a day (TID) | ORAL | Status: DC | PRN

## 2015-05-14 NOTE — Discharge Instructions (Signed)
Please read and follow all provided instructions.  Your diagnoses today include:  1. Rib pain    Tests performed today include:  Vital signs. See below for your results today.   Medications prescribed:   None   Home care instructions:  Follow any educational materials contained in this packet.  Follow-up instructions: Please follow-up with your primary care provider in the next 48 hours for further evaluation of symptoms and treatment   Return instructions:   Please return to the Emergency Department if you do not get better, if you get worse, or new symptoms OR  - Fever (temperature greater than 101.60F)  - Bleeding that does not stop with holding pressure to the area    -Severe pain (please note that you may be more sore the day after your accident)  - Chest Pain  - Difficulty breathing  - Severe nausea or vomiting  - Inability to tolerate food and liquids  - Passing out  - Skin becoming red around your wounds  - Change in mental status (confusion or lethargy)  - New numbness or weakness     Please return if you have any other emergent concerns.  Additional Information:  Your vital signs today were: BP 116/83 mmHg   Pulse 80   Temp(Src) 97.7 F (36.5 C) (Oral)   Resp 16   SpO2 99% If your blood pressure (BP) was elevated above 135/85 this visit, please have this repeated by your doctor within one month. ---------------

## 2015-05-14 NOTE — ED Provider Notes (Signed)
CSN: [id number]     Arrival date & time 05/14/15  1851 History  By signing my name below, I, Linna Darner, attest that this documentation has been prepared under the direction and in the presence of non-physician practitioner, Audry Pili, PA-C. Electronically Signed: Linna Darner, Scribe. 05/14/2015. 7:20 PM.    Chief Complaint  Patient presents with  . Rib Injury    The history is provided by the patient. No language interpreter was used.     HPI Comments: [name] is a 25 y.o. male with h/o asthma who presents to the Emergency Department complaining of sudden onset, constant, worsening, sharp, stabbing, 8/10 right ribcage pain for the last 8 days. Pt states that he was skateboarding 8 days ago and hit his chest and face on the ground. He notes that it feels like there is something protruding in his upper right ribcage. He notes that his right rib pain is exacerbated significantly with ambulating, breathing, or right arm movement.  Pt denies nausea, vomiting, diarrhea, abdominal pain, fever, or any other associated symptoms  Past Medical History  Diagnosis Date  . Psoriasis     right foot  . Asthma     triggered by cold weather; prn inhaler  . Anxiety     no current med.  . Fracture of metacarpal 02/13/2012    right small  . Depression    Past Surgical History  Procedure Laterality Date  . Tonsillectomy  age 25  . Closed reduction finger with percutaneous pinning  02/16/2012    Procedure: CLOSED REDUCTION FINGER WITH PERCUTANEOUS PINNING;  Surgeon: Marlowe Shores, MD;  Location: [facility];  Service: Orthopedics;  Laterality: Right;  CLOSED REDUCTION PINNING RT SMALL METACARPAL FX.   No family history on file. Social History  Substance Use Topics  . Smoking status: Current Every Day Smoker -- 1.00 packs/day for 6 years    Types: Cigarettes  . Smokeless tobacco: Never Used  . Alcohol Use: Yes     Comment: "couple times a week" "1-6 beers" last drink  was on Tuesday. 06/07/14    Review of Systems  A complete 10 system review of systems was obtained and all systems are negative except as noted in the HPI and PMH.    Allergies  Review of patient's allergies indicates no known allergies.  Home Medications   Prior to Admission medications   Medication Sig Start Date End Date Taking? Authorizing Provider  cyclobenzaprine (FLEXERIL) 5 MG tablet Take 1 tablet (5 mg total) by mouth 3 (three) times daily as needed for muscle spasms. 01/31/15   Linna Hoff, MD  diclofenac sodium (VOLTAREN) 1 % GEL Apply 4 g topically 4 (four) times daily. To right shoulder 01/31/15   Linna Hoff, MD  gabapentin (NEURONTIN) 100 MG capsule Take 1 capsule (100 mg total) by mouth 3 (three) times daily at 8am, 2pm and bedtime. 06/10/14   Adonis Brook, NP  hydrOXYzine (ATARAX/VISTARIL) 25 MG tablet Take 1 tablet (25 mg total) by mouth every 6 (six) hours as needed for anxiety. 06/10/14   Adonis Brook, NP  traZODone (DESYREL) 50 MG tablet Take 1 tablet (50 mg total) by mouth at bedtime and may repeat dose one time if needed. 06/10/14   Adonis Brook, NP  venlafaxine XR (EFFEXOR-XR) 37.5 MG 24 hr capsule Take 1 capsule (37.5 mg total) by mouth daily with breakfast. 06/10/14   Adonis Brook, NP   BP 116/83 mmHg  Pulse 80  Temp(Src) 97.7  F (36.5 C) (Oral)  Resp 16  SpO2 99%   Physical Exam  Constitutional: He is oriented to person, place, and time. He appears well-developed and well-nourished. No distress.  HENT:  Head: Normocephalic and atraumatic.  Eyes: Conjunctivae and EOM are normal. Pupils are equal, round, and reactive to light.  Neck: Normal range of motion. Neck supple. No tracheal deviation present.  Cardiovascular: Normal rate, regular rhythm and normal heart sounds.   No murmur heard. Pulmonary/Chest: Effort normal and breath sounds normal. No respiratory distress. He has no wheezes. He has no rales. He exhibits tenderness and bony tenderness.     Abdominal: Soft.  Musculoskeletal: Normal range of motion.  Neurological: He is alert and oriented to person, place, and time.  Skin: Skin is warm and dry.  Psychiatric: He has a normal mood and affect. His behavior is normal.  Nursing note and vitals reviewed.   ED Course  Procedures (including critical care time)  DIAGNOSTIC STUDIES: Oxygen Saturation is 99% on RA, normal by my interpretation.    COORDINATION OF CARE: 7:20 PM Discussed treatment plan with pt at bedside and pt agreed to plan.  Labs Review Labs Reviewed - No data to display  Imaging Review Dg Chest 2 View  05/14/2015  CLINICAL DATA:  Right-sided chest pain EXAM: CHEST  2 VIEW COMPARISON:  12/16/2011 FINDINGS: Normal heart size. Lungs clear. No pneumothorax. No pleural effusion. IMPRESSION: No active cardiopulmonary disease. Electronically Signed   By: Jolaine ClickArthur  Hoss M.D.   On: 05/14/2015 20:19   I have personally reviewed and evaluated these images and lab results as part of my medical decision-making.   EKG Interpretation None      MDM   I have reviewed and evaluated the relevant imaging studies. I have reviewed the relevant previous healthcare records. I obtained HPI from historian.  ED Course:  Assessment: Pt is a 24yM who presents with right rib pain. Not fall from skateboard x 1 week ago. On exam, pt in NAD. Nontoxic/nonseptic appearing. VSS. Afebrile. Lungs CTA. Heart RRR. Abdomen nontender soft. TTP over anterior chest wall. CXR shows no rib fx or acute abnormalities. Possible Costochondritis. Plan is to DC home with analgesia and incentive spirometer and follow up with PCP if symptoms continue. At time of discharge, Patient is in no acute distress. Vital Signs are stable. Patient is able to ambulate. Patient able to tolerate PO.    Disposition/Plan:  DC Home Additional Verbal discharge instructions given and discussed with patient.  Pt Instructed to f/u with PCP in the next week for evaluation and  treatment of symptoms. Return precautions given Pt acknowledges and agrees with plan  Supervising Physician Rolland PorterMark James, MD   Final diagnoses:  Rib pain   I personally performed the services described in this documentation, which was scribed in my presence. The recorded information has been reviewed and is accurate.     Audry Piliyler Loye Vento, PA-C 05/14/15 2036  Rolland PorterMark James, MD 05/26/15 [phone]

## 2015-05-14 NOTE — ED Notes (Signed)
Pt back from x-ray.

## 2015-05-14 NOTE — ED Notes (Signed)
Pt reports to the ED for eval of right ribcage pain since Sunday. Reports he was skateboarding and he injured his ribs. States it has been getting progressively worse. Pt denies any SOB or cough. Denies any other injury. Pt A&OX4, resp e/u, and skin warm and dry.

## 2015-10-22 ENCOUNTER — Emergency Department (HOSPITAL_COMMUNITY)
Admission: EM | Admit: 2015-10-22 | Discharge: 2015-10-22 | Disposition: A | Discharge status: Home / Self Care | Payer: Self-pay | Attending: Emergency Medicine | Admitting: Emergency Medicine

## 2015-10-22 ENCOUNTER — Emergency Department (HOSPITAL_COMMUNITY): Payer: Self-pay

## 2015-10-22 ENCOUNTER — Encounter (HOSPITAL_COMMUNITY): Payer: Self-pay

## 2015-10-22 DIAGNOSIS — F17210 Nicotine dependence, cigarettes, uncomplicated: Secondary | ICD-10-CM | POA: Insufficient documentation

## 2015-10-22 DIAGNOSIS — R21 Rash and other nonspecific skin eruption: Secondary | ICD-10-CM | POA: Insufficient documentation

## 2015-10-22 DIAGNOSIS — M25512 Pain in left shoulder: Secondary | ICD-10-CM | POA: Insufficient documentation

## 2015-10-22 MED ORDER — NAPROXEN 500 MG PO TABS: 500.0000 mg | ORAL_TABLET | Freq: Two times a day (BID) | ORAL | 0 refills | Status: DC

## 2015-10-22 MED ORDER — HYDROCORTISONE 1 % EX CREA: TOPICAL_CREAM | CUTANEOUS | 0 refills | Status: DC

## 2015-10-22 NOTE — ED Triage Notes (Signed)
Pt states left shoulder pain x 1 week. Pt also states new rash to L arm 3 days ago.

## 2015-10-22 NOTE — ED Provider Notes (Signed)
[facility]-EMERGENCY DEPT Provider Note   CSN: [id number] Arrival date & time: 10/22/15  1655  By signing my name below, I, Phillis HaggisGabriella Gaje, attest that this documentation has been prepared under the direction and in the presence of Felicie Mornavid Humbert Morozov, PA-C. Electronically Signed: Phillis HaggisGabriella Gaje, ED Scribe. 10/22/15. 7:41 PM.  History   Chief Complaint Chief Complaint  Patient presents with  . Shoulder Pain  . Rash   The history is provided by the patient. No language interpreter was used.  Shoulder Pain   This is a new problem. The current episode started more than 1 week ago. The problem occurs constantly. The problem has been gradually worsening. The pain is present in the left shoulder and neck. The quality of the pain is described as aching. The pain is mild. Pertinent negatives include no numbness. He has tried nothing for the symptoms.  HPI Comments: [name] is a 25 y.o. male with a hx of psoriasis who presents to the Emergency Department complaining of gradually worsening, aching, left shoulder pain that radiates to the left neck and upper back onset one week ago. Pt reports that he had a red, itching rash appear on his left arm 3 days ago. He states that he does a lot of heavy lifting and movement at work. He has not tried anything for his symptoms. He denies contact with chemicals, working outside, injury, fever, chills, nausea, vomiting, numbness, or weakness.   Past Medical History:  Diagnosis Date  . Anxiety    no current med.  . Asthma    triggered by cold weather; prn inhaler  . Depression   . Fracture of metacarpal 02/13/2012   right small  . Psoriasis    right foot    Patient Active Problem List   Diagnosis Date Noted  . Panic disorder 06/07/2014  . Adjustment disorder with depressed mood 06/07/2014  . Cannabis use disorder, moderate, dependence (HCC) 06/07/2014  . Tobacco use disorder 06/07/2014    Past Surgical History:  Procedure Laterality Date  . CLOSED  REDUCTION FINGER WITH PERCUTANEOUS PINNING  02/16/2012   Procedure: CLOSED REDUCTION FINGER WITH PERCUTANEOUS PINNING;  Surgeon: Marlowe ShoresMatthew A Weingold, MD;  Location: [facility];  Service: Orthopedics;  Laterality: Right;  CLOSED REDUCTION PINNING RT SMALL METACARPAL FX.  . TONSILLECTOMY  age 89   Home Medications    Prior to Admission medications   Medication Sig Start Date End Date Taking? Authorizing Provider  cyclobenzaprine (FLEXERIL) 5 MG tablet Take 1 tablet (5 mg total) by mouth 3 (three) times daily as needed for muscle spasms. 01/31/15   Linna HoffJames D Kindl, MD  diclofenac sodium (VOLTAREN) 1 % GEL Apply 4 g topically 4 (four) times daily. To right shoulder 01/31/15   Linna HoffJames D Kindl, MD  gabapentin (NEURONTIN) 100 MG capsule Take 1 capsule (100 mg total) by mouth 3 (three) times daily at 8am, 2pm and bedtime. 06/10/14   Adonis BrookSheila Agustin, NP  hydrOXYzine (ATARAX/VISTARIL) 25 MG tablet Take 1 tablet (25 mg total) by mouth every 6 (six) hours as needed for anxiety. 06/10/14   Adonis BrookSheila Agustin, NP  oxyCODONE-acetaminophen (PERCOCET/ROXICET) 5-325 MG tablet Take 1 tablet by mouth every 8 (eight) hours as needed for severe pain. 05/14/15   Audry Piliyler Mohr, PA-C  traZODone (DESYREL) 50 MG tablet Take 1 tablet (50 mg total) by mouth at bedtime and may repeat dose one time if needed. 06/10/14   Adonis BrookSheila Agustin, NP  venlafaxine XR (EFFEXOR-XR) 37.5 MG 24 hr capsule Take 1 capsule (  37.5 mg total) by mouth daily with breakfast. 06/10/14   Adonis BrookSheila Agustin, NP    Family History History reviewed. No pertinent family history.  Social History Social History  Substance Use Topics  . Smoking status: Current Every Day Smoker    Packs/day: 1.00    Years: 6.00    Types: Cigarettes  . Smokeless tobacco: Never Used  . Alcohol use Yes     Comment: "couple times a week" "1-6 beers" last drink was on Tuesday. 06/07/14     Allergies   Review of patient's allergies indicates no known allergies.   Review of  Systems Review of Systems  Constitutional: Negative for chills and fever.  Musculoskeletal: Positive for arthralgias.  Skin: Positive for rash.  Neurological: Negative for weakness and numbness.  All other systems reviewed and are negative.  Physical Exam Updated Vital Signs BP 122/70   Pulse 70   Temp 98.4 F (36.9 C) (Oral)   Resp 14   SpO2 99%   Physical Exam  Constitutional: He is oriented to person, place, and time. He appears well-developed and well-nourished.  HENT:  Head: Normocephalic and atraumatic.  Eyes: Conjunctivae and EOM are normal. Pupils are equal, round, and reactive to light.  Neck: Normal range of motion. Neck supple.  Musculoskeletal: Normal range of motion.  No tenderness to left shoulder, good ROM  Neurological: He is alert and oriented to person, place, and time.  Skin: Skin is warm and dry.  Vesicular, non-pruitic rash to the upper left shoulder, upper left arm, not extending below the elbow  Psychiatric: He has a normal mood and affect. His behavior is normal.  Nursing note and vitals reviewed.    ED Treatments / Results  DIAGNOSTIC STUDIES: Oxygen Saturation is 99% on RA, normal by my interpretation.    COORDINATION OF CARE: 7:37 PM-Discussed treatment plan which includes x-ray, anti-inflammatory and topical steroid cream with pt at bedside and pt agreed to plan.    Labs (all labs ordered are listed, but only abnormal results are displayed) Labs Reviewed - No data to display  EKG  EKG Interpretation None       Radiology Dg Shoulder Left  Result Date: 10/22/2015 CLINICAL DATA:  Shoulder pain EXAM: LEFT SHOULDER - 2+ VIEW COMPARISON:  None. FINDINGS: There is no evidence of fracture or dislocation. There is no evidence of arthropathy or other focal bone abnormality. Soft tissues are unremarkable. IMPRESSION: Negative. Electronically Signed   By: Signa Kellaylor  Stroud M.D.   On: 10/22/2015 17:53    Procedures Procedures (including critical  care time)  Medications Ordered in ED Medications - No data to display   Initial Impression / Assessment and Plan / ED Course  I have reviewed the triage vital signs and the nursing notes.  Pertinent labs & imaging results that were available during my care of the patient were reviewed by me and considered in my medical decision making (see chart for details).  Clinical Course      Final Clinical Impressions(s) / ED Diagnoses  Patient X-Ray negative for obvious fracture or dislocation. Pain managed in ED. Pt advised to follow up with orthopedics if symptoms persist. Patient given brace while in ED, conservative therapy recommended and discussed. Patient will be dc home & is agreeable with above plan.    Patient with nonspecific eruption. No signs of infection. Discharge with symptomatic treatment. Follow up with PCP in 2-3 days. Topical steroid.  Follow-up with [facility].    Final diagnoses:  None  Left shoulder pain. Non-specific rash.  I personally performed the services described in this documentation, which was scribed in my presence. The recorded information has been reviewed and is accurate.    New Prescriptions New Prescriptions   No medications on file     Felicie Morn, NP 10/22/15 1610    Nelva Nay, MD 10/24/15 1346

## 2015-10-23 ENCOUNTER — Encounter (HOSPITAL_COMMUNITY): Payer: Self-pay | Admitting: Emergency Medicine

## 2015-10-23 ENCOUNTER — Ambulatory Visit (HOSPITAL_COMMUNITY)
Admission: EM | Admit: 2015-10-23 | Discharge: 2015-10-23 | Disposition: A | Discharge status: Home / Self Care | Payer: Self-pay | Attending: Emergency Medicine | Admitting: Emergency Medicine

## 2015-10-23 DIAGNOSIS — B029 Zoster without complications: Secondary | ICD-10-CM

## 2015-10-23 MED ORDER — OXYCODONE-ACETAMINOPHEN 5-325 MG PO TABS: 1.0000 | ORAL_TABLET | ORAL | 0 refills | Status: DC | PRN

## 2015-10-23 MED ORDER — GABAPENTIN 300 MG PO CAPS: ORAL_CAPSULE | ORAL | 0 refills | Status: DC

## 2015-10-23 MED ORDER — ACYCLOVIR 5 % EX OINT: 1.0000 "application " | TOPICAL_OINTMENT | CUTANEOUS | 0 refills | Status: DC

## 2015-10-23 NOTE — ED Provider Notes (Signed)
CSN: [id number]     Arrival date & time 10/23/15  1201 History   First MD Initiated Contact with Patient 10/23/15 1258     Chief Complaint  Patient presents with  . Rash  . Shoulder Pain   (Consider location/radiation/quality/duration/timing/severity/associated sxs/prior Treatment) 25 year old male presents with a painful rash to the left arm and shoulder. He states the pain started approximately 8 days ago followed by a rash. Describes the pain as sharp, shooting and burning and stinging. There are patches of vesicular vesicular lesions over a red base to the left shoulder, upper arm and forearm. Denies injury to the shoulder. Denies fall or blunt trauma. Even know he has a job in which requires a lot of lifting and other work he does not complain of myalgias or exacerbation of pain with movement or lifting. He was seen in the emergency department yesterday diagnosed with unknown type rash and treated with corticosteroid cream. He did not get that filled. X-rays performed in the emergency department last evening of the left shoulder showed no abnormalities.      Past Medical History:  Diagnosis Date  . Anxiety    no current med.  . Asthma    triggered by cold weather; prn inhaler  . Depression   . Fracture of metacarpal 02/13/2012   right small  . Psoriasis    right foot   Past Surgical History:  Procedure Laterality Date  . CLOSED REDUCTION FINGER WITH PERCUTANEOUS PINNING  02/16/2012   Procedure: CLOSED REDUCTION FINGER WITH PERCUTANEOUS PINNING;  Surgeon: Marlowe Shores, MD;  Location: [facility];  Service: Orthopedics;  Laterality: Right;  CLOSED REDUCTION PINNING RT SMALL METACARPAL FX.  . TONSILLECTOMY  age 83   History reviewed. No pertinent family history. Social History  Substance Use Topics  . Smoking status: Current Every Day Smoker    Packs/day: 1.00    Years: 6.00    Types: Cigarettes  . Smokeless tobacco: Never Used  . Alcohol use Yes   Comment: "couple times a week" "1-6 beers" last drink was on Tuesday. 06/07/14    Review of Systems  Constitutional: Negative.   HENT: Negative.   Eyes: Negative.   Respiratory: Negative.   Skin: Positive for color change and rash.  Neurological: Negative.   All other systems reviewed and are negative.   Allergies  Review of patient's allergies indicates no known allergies.  Home Medications   Prior to Admission medications   Medication Sig Start Date End Date Taking? Authorizing Provider  acyclovir ointment (ZOVIRAX) 5 % Apply 1 application topically every 3 (three) hours. 10/23/15   Hayden Rasmussen, NP  cyclobenzaprine (FLEXERIL) 5 MG tablet Take 1 tablet (5 mg total) by mouth 3 (three) times daily as needed for muscle spasms. 01/31/15   Linna Hoff, MD  diclofenac sodium (VOLTAREN) 1 % GEL Apply 4 g topically 4 (four) times daily. To right shoulder 01/31/15   Linna Hoff, MD  gabapentin (NEURONTIN) 300 MG capsule 1 tab po at bedtime 1st day, 1 tablet bid second day, then 1 tablet tid 10/23/15   Hayden Rasmussen, NP  hydrocortisone cream 1 % Apply to affected area 2 times daily 10/22/15   Felicie Morn, NP  hydrOXYzine (ATARAX/VISTARIL) 25 MG tablet Take 1 tablet (25 mg total) by mouth every 6 (six) hours as needed for anxiety. 06/10/14   Adonis Brook, NP  naproxen (NAPROSYN) 500 MG tablet Take 1 tablet (500 mg total) by mouth 2 (two) times daily. 10/22/15  Felicie Mornavid Smith, NP  oxyCODONE-acetaminophen (PERCOCET/ROXICET) 5-325 MG tablet Take 1 tablet by mouth every 4 (four) hours as needed for severe pain. 10/23/15   Hayden Rasmussenavid Jerard Bays, NP  traZODone (DESYREL) 50 MG tablet Take 1 tablet (50 mg total) by mouth at bedtime and may repeat dose one time if needed. 06/10/14   Adonis BrookSheila Agustin, NP  venlafaxine XR (EFFEXOR-XR) 37.5 MG 24 hr capsule Take 1 capsule (37.5 mg total) by mouth daily with breakfast. 06/10/14   Adonis BrookSheila Agustin, NP   Meds Ordered and Administered this Visit  Medications - No data to display  BP (!)  119/54 (BP Location: Right Arm)   Pulse (!) 58   Temp 98.6 F (37 C) (Oral)   Resp 16   Ht 5\' 6"  (1.676 m)   Wt 130 lb (59 kg)   SpO2 100%   BMI 20.98 kg/m  No data found.   Physical Exam  Constitutional: He is oriented to person, place, and time. He appears well-developed and well-nourished. No distress.  HENT:  Head: Normocephalic and atraumatic.  Eyes: EOM are normal.  Neck: Normal range of motion.  Cardiovascular: Normal rate.   Pulmonary/Chest: Effort normal.  Musculoskeletal: Normal range of motion. He exhibits no edema or deformity.  Neurological: He is alert and oriented to person, place, and time. No cranial nerve deficit.  Skin: Skin is warm and dry. Rash noted.  There are several patches of a papular vesicular rash over a red base to the left upper extremity and shoulder. This follows a pattern that includes dermatome C5 and C6. Positive for tenderness. No evidence of cellulitis or lymphangitis.  Nursing note and vitals reviewed.   Urgent Care Course   Clinical Course    Procedures (including critical care time)  Labs Review Labs Reviewed - No data to display  Imaging Review Dg Shoulder Left  Result Date: 10/22/2015 CLINICAL DATA:  Shoulder pain EXAM: LEFT SHOULDER - 2+ VIEW COMPARISON:  None. FINDINGS: There is no evidence of fracture or dislocation. There is no evidence of arthropathy or other focal bone abnormality. Soft tissues are unremarkable. IMPRESSION: Negative. Electronically Signed   By: Signa Kellaylor  Stroud M.D.   On: 10/22/2015 17:53     Visual Acuity Review  Right Eye Distance:   Left Eye Distance:   Bilateral Distance:    Right Eye Near:   Left Eye Near:    Bilateral Near:         MDM   1. Shingles    Meds ordered this encounter  Medications  . acyclovir ointment (ZOVIRAX) 5 %    Sig: Apply 1 application topically every 3 (three) hours.    Dispense:  30 g    Refill:  0    Order Specific Question:   Supervising Provider     Answer:   Linna HoffKINDL, JAMES D [phone]]  . oxyCODONE-acetaminophen (PERCOCET/ROXICET) 5-325 MG tablet    Sig: Take 1 tablet by mouth every 4 (four) hours as needed for severe pain.    Dispense:  15 tablet    Refill:  0    Order Specific Question:   Supervising Provider    Answer:   Linna HoffKINDL, JAMES D [phone][[id number]]  . gabapentin (NEURONTIN) 300 MG capsule    Sig: 1 tab po at bedtime 1st day, 1 tablet bid second day, then 1 tablet tid    Dispense:  30 capsule    Refill:  0    Order Specific Question:   Supervising Provider    Answer:  Linna HoffKINDL, JAMES D [[id number]]       Hayden Rasmussenavid Leighton Luster, NP 10/23/15 1329    Hayden Rasmussenavid Shikita Vaillancourt, NP 10/23/15 1330

## 2015-10-23 NOTE — ED Triage Notes (Signed)
Pt is here with complaints of rash on left upper arm and shoulder and a rash that developed three days ago.  Pt was seen in the ED yesterday evening for the same complaints.

## 2019-06-09 ENCOUNTER — Other Ambulatory Visit: Payer: Self-pay

## 2019-06-09 ENCOUNTER — Encounter (HOSPITAL_COMMUNITY): Payer: Self-pay

## 2019-06-09 ENCOUNTER — Ambulatory Visit (HOSPITAL_COMMUNITY)
Admission: EM | Admit: 2019-06-09 | Discharge: 2019-06-09 | Disposition: A | Discharge status: Home / Self Care | Payer: Self-pay | Attending: Family Medicine | Admitting: Family Medicine

## 2019-06-09 ENCOUNTER — Ambulatory Visit (INDEPENDENT_AMBULATORY_CARE_PROVIDER_SITE_OTHER): Payer: Self-pay

## 2019-06-09 DIAGNOSIS — S62024A Nondisplaced fracture of middle third of navicular [scaphoid] bone of right wrist, initial encounter for closed fracture: Secondary | ICD-10-CM

## 2019-06-09 DIAGNOSIS — W19XXXA Unspecified fall, initial encounter: Secondary | ICD-10-CM

## 2019-06-09 DIAGNOSIS — S62001A Unspecified fracture of navicular [scaphoid] bone of right wrist, initial encounter for closed fracture: Secondary | ICD-10-CM

## 2019-06-09 MED ORDER — HYDROCODONE-ACETAMINOPHEN 7.5-325 MG PO TABS: 1.0000 | ORAL_TABLET | Freq: Four times a day (QID) | ORAL | 0 refills | Status: DC | PRN

## 2019-06-09 NOTE — Progress Notes (Signed)
Orthopedic Tech Progress Note Patient Details:  [name] [birth year] [id number]  Ortho Devices Type of Ortho Device: Ace wrap, Sling immobilizer, Short arm splint Ortho Device/Splint Location: right Ortho Device/Splint Interventions: Application   Post Interventions Patient Tolerated: Well Instructions Provided: Care of device   Saul Fordyce 06/09/2019, 6:13 PM

## 2019-06-09 NOTE — Discharge Instructions (Signed)
Call Dr. Hinda Glatter office in the morning for an appointment Use ice and elevation to reduce pain and swelling Leave on brace.  Leave arm in splint.  Do not use right arm Take pain medication as needed.  Do not drive on the pain medication

## 2019-06-09 NOTE — ED Notes (Signed)
Ortho tech called and voicemail left by Sarajane Jews, RN.

## 2019-06-09 NOTE — ED Triage Notes (Signed)
Patient reports he was running down a hill last night and fell. Reporting 9/10 wrist pain today, but only 7/10 in clinic.

## 2019-06-09 NOTE — ED Notes (Signed)
Ortho tech called for 2nd time and voicemail left by [employer], CMA. Pt informed that we are awaiting ortho tech arrival.

## 2019-06-09 NOTE — ED Provider Notes (Signed)
[facility]    CSN: [id number] Arrival date & time: 06/09/19  1616      History   Chief Complaint Chief Complaint  Patient presents with  . Wrist Injury    HPI [name] is a 29 y.o. male.   HPI   Patient was running down a hill and fell.  Injured his right arm.  It swollen, bruised, and very painful.  The pain is mostly in his wrist. He has never fractured this wrist before.  He does have an old boxer's fracture with his hand that healed well.  This was in 2013 Patient states he is in good health.  He is currently not taking any medications. He works for a [employer].  He needs to do repetitive and sometimes heavy lifting for his job  Past Medical History:  Diagnosis Date  . Anxiety    no current med.  . Asthma    triggered by cold weather; prn inhaler  . Depression   . Fracture of metacarpal 02/13/2012   right small  . Psoriasis    right foot    Patient Active Problem List   Diagnosis Date Noted  . Panic disorder 06/07/2014  . Adjustment disorder with depressed mood 06/07/2014  . Cannabis use disorder, moderate, dependence (HCC) 06/07/2014  . Tobacco use disorder 06/07/2014    Past Surgical History:  Procedure Laterality Date  . CLOSED REDUCTION FINGER WITH PERCUTANEOUS PINNING  02/16/2012   Procedure: CLOSED REDUCTION FINGER WITH PERCUTANEOUS PINNING;  Surgeon: Marlowe Shores, MD;  Location: [facility];  Service: Orthopedics;  Laterality: Right;  CLOSED REDUCTION PINNING RT SMALL METACARPAL FX.  . TONSILLECTOMY  age 2       Home Medications    Prior to Admission medications   Medication Sig Start Date End Date Taking? Authorizing Provider  acyclovir ointment (ZOVIRAX) 5 % Apply 1 application topically every 3 (three) hours. 10/23/15   Hayden Rasmussen, NP  HYDROcodone-acetaminophen (NORCO) 7.5-325 MG tablet Take 1 tablet by mouth every 6 (six) hours as needed for moderate pain. 06/09/19   Eustace Moore, MD    gabapentin (NEURONTIN) 300 MG capsule 1 tab po at bedtime 1st day, 1 tablet bid second day, then 1 tablet tid 10/23/15 06/09/19  Hayden Rasmussen, NP  venlafaxine XR (EFFEXOR-XR) 37.5 MG 24 hr capsule Take 1 capsule (37.5 mg total) by mouth daily with breakfast. 06/10/14 06/09/19  Adonis Brook, NP    Family History No family history on file.  Social History Social History   Tobacco Use  . Smoking status: Current Every Day Smoker    Packs/day: 1.00    Years: 6.00    Pack years: 6.00    Types: Cigarettes  . Smokeless tobacco: Never Used  Substance Use Topics  . Alcohol use: Yes    Comment: "couple times a week" "1-6 beers" last drink was on Tuesday. 06/07/14  . Drug use: Yes    Types: Marijuana     Allergies   Patient has no known allergies.   Review of Systems Review of Systems  Musculoskeletal: Positive for arthralgias.  Skin: Positive for wound.     Physical Exam Triage Vital Signs ED Triage Vitals  Enc Vitals Group     BP 06/09/19 1634 133/74     Pulse Rate 06/09/19 1634 76     Resp 06/09/19 1634 18     Temp 06/09/19 1634 97.9 F (36.6 C)     Temp Source 06/09/19 1634 Oral  SpO2 06/09/19 1634 98 %     Weight --      Height --      Head Circumference --      Peak Flow --      Pain Score 06/09/19 1632 7     Pain Loc --      Pain Edu? --      Excl. in [place]? --    No data found.  Updated Vital Signs BP 133/74 (BP Location: Left Arm)   Pulse 76   Temp 97.9 F (36.6 C) (Oral)   Resp 18   SpO2 98%     Physical Exam Constitutional:      General: He is not in acute distress.    Appearance: He is well-developed and normal weight.  HENT:     Head: Normocephalic and atraumatic.     Mouth/Throat:     Comments: Mask in place Eyes:     Conjunctiva/sclera: Conjunctivae normal.     Pupils: Pupils are equal, round, and reactive to light.  Cardiovascular:     Rate and Rhythm: Normal rate.  Pulmonary:     Effort: Pulmonary effort is normal. No respiratory  distress.  Musculoskeletal:        General: Normal range of motion.     Cervical back: Normal range of motion.     Comments: Right upper extremity shows trauma.  There is abrasion on the elbow at the lateral epicondyles.  Elbow motion is full.  No tenderness to the bony structures of the elbow.  There are some abrasions on the forearm and lateral/ulnar portion of the wrist.  These are superficial.  They have been cleaned and are not bleeding.  The main swelling is seen of the dorsum of the wrist on the radial aspect.  There is mild tenderness in this region.  Tenderness in the snuffbox that is also mild.  Patient has a lot of tenderness with any attempted range of motion of the wrist.  Normal movement of fingers.  Normal sensation distally.  Skin:    General: Skin is warm and dry.  Neurological:     Mental Status: He is alert.      UC Treatments / Results  Labs (all labs ordered are listed, but only abnormal results are displayed) Labs Reviewed - No data to display  EKG   Radiology DG Wrist Complete Right  Result Date: 06/09/2019 CLINICAL DATA:  Pain after fall EXAM: RIGHT WRIST - COMPLETE 3+ VIEW COMPARISON:  None. FINDINGS: Frontal, oblique, lateral, and ulnar deviation scaphoid images were obtained. There is a transversely oriented fracture through the midportion of the scaphoid with alignment essentially anatomic. No other fracture. No dislocation. Joint spaces appear normal. No erosive change. IMPRESSION: Transversely oriented fracture of the mid scaphoid bone with alignment essentially anatomic. No other fracture evident. No dislocation. No evident arthropathy. These results will be called to the ordering clinician or representative by the Radiologist Assistant, and communication documented in the PACS or [employer]. Electronically Signed   By: Lowella Grip III M.D.   On: 06/09/2019 17:08    Procedures Procedures (including critical care time)  Medications Ordered in  UC Medications - No data to display  Initial Impression / Assessment and Plan / UC Course  I have reviewed the triage vital signs and the nursing notes.  Pertinent labs & imaging results that were available during my care of the patient were reviewed by me and considered in my medical decision making (see chart for  details).     Acute scaphoid fracture.  I called Dr. Ramond Marrow on-call for orthopedics.  He recommends the patient see a hand specialist.  I therefore called Dr. Roney Mans with [facility].  Patient is placed into a splint for immobilization.  Will follow up with Dr. Roney Mans Final Clinical Impressions(s) / UC Diagnoses   Final diagnoses:  Closed nondisplaced fracture of middle third of scaphoid bone of right wrist, initial encounter     Discharge Instructions     Call Dr. Hinda Glatter office in the morning for an appointment Use ice and elevation to reduce pain and swelling Leave on brace.  Leave arm in splint.  Do not use right arm Take pain medication as needed.  Do not drive on the pain medication    ED Prescriptions    Medication Sig Dispense Auth. Provider   HYDROcodone-acetaminophen (NORCO) 7.5-325 MG tablet Take 1 tablet by mouth every 6 (six) hours as needed for moderate pain. 15 tablet Eustace Moore, MD     I have reviewed the PDMP during this encounter.   Eustace Moore, MD 06/09/19 [phone]

## 2019-06-09 NOTE — ED Notes (Signed)
Called [facility] on [address] to confirm cancellation of hydrocodone Rx; pt had requested Rx be sent to [facility] instead b/c [facility] was about to close. Left voicemail on [facility] pharmacy line to cancel Rx.

## 2019-06-15 ENCOUNTER — Encounter: Payer: Self-pay | Admitting: Orthopaedic Surgery

## 2019-06-15 ENCOUNTER — Ambulatory Visit: Payer: Self-pay | Admitting: Orthopaedic Surgery

## 2019-06-15 ENCOUNTER — Other Ambulatory Visit: Payer: Self-pay

## 2019-06-15 DIAGNOSIS — S62024A Nondisplaced fracture of middle third of navicular [scaphoid] bone of right wrist, initial encounter for closed fracture: Secondary | ICD-10-CM | POA: Insufficient documentation

## 2019-06-15 NOTE — Progress Notes (Signed)
Office Visit Note   Patient: [name]           Date of Birth: 1990-06-11           MRN: [id number] Visit Date: 06/15/2019              Requested by: No referring provider defined for this encounter. PCP: Default, Provider, MD   Assessment & Plan: Visit Diagnoses:  1. Closed nondisplaced fracture of middle third of scaphoid bone of right wrist, initial encounter     Plan: Impression is nondisplaced right middle third scaphoid fracture.  I reviewed the x-rays with [name] today and discuss the benefits of surgical fixation versus the risks of surgery and nonoperative treatment.  Based on our discussion patient has elected to proceed with surgical fixation in the near future.  We placed him in a thumb spica brace to wear at all times for now.  Questions encouraged and answered.  Follow-Up Instructions: Return for 2 week postop visit.   Orders:  No orders of the defined types were placed in this encounter.  No orders of the defined types were placed in this encounter.     Procedures: No procedures performed   Clinical Data: No additional findings.   Subjective: Chief Complaint  Patient presents with  . Right Wrist - Pain    [name] is a 29 year old right-hand-dominant healthy male who comes in today for evaluation of a recent right scaphoid fracture that he suffered on 06/09/2019 from a mechanical fall while running downhill.  He was placed in a splint and he is a ED follow-up.  Denies any numbness and tingling.  He does have some mild swelling as well as some superficial abrasions along the forearm.   Review of Systems  Constitutional: Negative.   All other systems reviewed and are negative.    Objective: Vital Signs: There were no vitals taken for this visit.  Physical Exam Vitals and nursing note reviewed.  Constitutional:      Appearance: He is well-developed.  HENT:     Head: Normocephalic and atraumatic.  Eyes:     Pupils: Pupils are equal, round,  and reactive to light.  Pulmonary:     Effort: Pulmonary effort is normal.  Abdominal:     Palpations: Abdomen is soft.  Musculoskeletal:        General: Normal range of motion.     Cervical back: Neck supple.  Skin:    General: Skin is warm.  Neurological:     Mental Status: He is alert and oriented to person, place, and time.  Psychiatric:        Behavior: Behavior normal.        Thought Content: Thought content normal.        Judgment: Judgment normal.     Ortho Exam Right wrist exam shows tenderness in the anatomic snuffbox.  He has some mild to moderate swelling.  Range of motion not tested.  Neurovascular intact distally. Specialty Comments:  No specialty comments available.  Imaging: No results found.   PMFS History: Patient Active Problem List   Diagnosis Date Noted  . Closed nondisplaced fracture of middle third of navicular bone of right wrist 06/15/2019  . Panic disorder 06/07/2014  . Adjustment disorder with depressed mood 06/07/2014  . Cannabis use disorder, moderate, dependence (HCC) 06/07/2014  . Tobacco use disorder 06/07/2014   Past Medical History:  Diagnosis Date  . Anxiety    no current med.  . Asthma  triggered by cold weather; prn inhaler  . Depression   . Fracture of metacarpal 02/13/2012   right small  . Psoriasis    right foot    History reviewed. No pertinent family history.  Past Surgical History:  Procedure Laterality Date  . CLOSED REDUCTION FINGER WITH PERCUTANEOUS PINNING  02/16/2012   Procedure: CLOSED REDUCTION FINGER WITH PERCUTANEOUS PINNING;  Surgeon: Schuyler Amor, MD;  Location: [facility];  Service: Orthopedics;  Laterality: Right;  CLOSED REDUCTION PINNING RT SMALL METACARPAL FX.  . TONSILLECTOMY  age 16   Social History   Occupational History  . Not on file  Tobacco Use  . Smoking status: Current Every Day Smoker    Packs/day: 1.00    Years: 6.00    Pack years: 6.00    Types: Cigarettes  .  Smokeless tobacco: Never Used  Substance and Sexual Activity  . Alcohol use: Yes    Comment: "couple times a week" "1-6 beers" last drink was on Tuesday. 06/07/14  . Drug use: Yes    Types: Marijuana  . Sexual activity: Never

## 2019-06-16 ENCOUNTER — Other Ambulatory Visit (HOSPITAL_COMMUNITY)
Admission: RE | Admit: 2019-06-16 | Discharge: 2019-06-16 | Disposition: A | Discharge status: Home / Self Care | Payer: Self-pay | Source: Ambulatory Visit | Attending: Orthopaedic Surgery | Admitting: Orthopaedic Surgery

## 2019-06-16 ENCOUNTER — Encounter (HOSPITAL_BASED_OUTPATIENT_CLINIC_OR_DEPARTMENT_OTHER): Payer: Self-pay | Admitting: Orthopaedic Surgery

## 2019-06-16 DIAGNOSIS — Z20822 Contact with and (suspected) exposure to covid-19: Secondary | ICD-10-CM | POA: Insufficient documentation

## 2019-06-16 DIAGNOSIS — Z01812 Encounter for preprocedural laboratory examination: Secondary | ICD-10-CM | POA: Insufficient documentation

## 2019-06-16 LAB — SARS CORONAVIRUS 2 (TAT 6-24 HRS): SARS Coronavirus 2: NEGATIVE

## 2019-06-20 ENCOUNTER — Encounter (HOSPITAL_BASED_OUTPATIENT_CLINIC_OR_DEPARTMENT_OTHER): Payer: Self-pay | Admitting: Orthopaedic Surgery

## 2019-06-20 ENCOUNTER — Encounter (HOSPITAL_BASED_OUTPATIENT_CLINIC_OR_DEPARTMENT_OTHER)
Admission: RE | Disposition: A | Discharge status: Home / Self Care | Payer: Self-pay | Source: Home / Self Care | Attending: Orthopaedic Surgery

## 2019-06-20 ENCOUNTER — Ambulatory Visit (HOSPITAL_BASED_OUTPATIENT_CLINIC_OR_DEPARTMENT_OTHER): Payer: Self-pay | Admitting: Certified Registered"

## 2019-06-20 ENCOUNTER — Ambulatory Visit (HOSPITAL_BASED_OUTPATIENT_CLINIC_OR_DEPARTMENT_OTHER)
Admission: RE | Admit: 2019-06-20 | Discharge: 2019-06-20 | Disposition: A | Discharge status: Home / Self Care | Payer: Self-pay | Attending: Orthopaedic Surgery | Admitting: Orthopaedic Surgery

## 2019-06-20 ENCOUNTER — Other Ambulatory Visit: Payer: Self-pay

## 2019-06-20 DIAGNOSIS — J45909 Unspecified asthma, uncomplicated: Secondary | ICD-10-CM | POA: Insufficient documentation

## 2019-06-20 DIAGNOSIS — W1781XA Fall down embankment (hill), initial encounter: Secondary | ICD-10-CM | POA: Insufficient documentation

## 2019-06-20 DIAGNOSIS — S62024A Nondisplaced fracture of middle third of navicular [scaphoid] bone of right wrist, initial encounter for closed fracture: Secondary | ICD-10-CM | POA: Insufficient documentation

## 2019-06-20 DIAGNOSIS — F17210 Nicotine dependence, cigarettes, uncomplicated: Secondary | ICD-10-CM | POA: Insufficient documentation

## 2019-06-20 DIAGNOSIS — Y9302 Activity, running: Secondary | ICD-10-CM | POA: Insufficient documentation

## 2019-06-20 HISTORY — PX: PERCUTANEOUS PINNING: SHX2209

## 2019-06-20 SURGERY — PINNING, EXTREMITY, PERCUTANEOUS
Anesthesia: Monitor Anesthesia Care | Site: Hand | Laterality: Right

## 2019-06-20 MED ORDER — OXYCODONE HCL 5 MG PO TABS: 5.0000 mg | ORAL_TABLET | Freq: Once | ORAL | Status: DC | PRN

## 2019-06-20 MED ORDER — DEXMEDETOMIDINE HCL 200 MCG/2ML IV SOLN
INTRAVENOUS | Status: DC | PRN
  Administered 2019-06-20: 12 ug via INTRAVENOUS

## 2019-06-20 MED ORDER — MIDAZOLAM HCL 2 MG/2ML IJ SOLN
INTRAMUSCULAR | Status: AC
  Filled 2019-06-20: qty 2

## 2019-06-20 MED ORDER — PROPOFOL 500 MG/50ML IV EMUL
INTRAVENOUS | Status: DC | PRN
  Administered 2019-06-20: 100 ug/kg/min via INTRAVENOUS

## 2019-06-20 MED ORDER — ONDANSETRON HCL 4 MG/2ML IJ SOLN: 4.0000 mg | Freq: Four times a day (QID) | INTRAMUSCULAR | Status: DC | PRN

## 2019-06-20 MED ORDER — BUPIVACAINE HCL (PF) 0.5 % IJ SOLN
INTRAMUSCULAR | Status: DC | PRN
  Administered 2019-06-20: 30 mL via PERINEURAL

## 2019-06-20 MED ORDER — LACTATED RINGERS IV SOLN: INTRAVENOUS | Status: DC

## 2019-06-20 MED ORDER — OXYCODONE-ACETAMINOPHEN 5-325 MG PO TABS: 1.0000 | ORAL_TABLET | Freq: Three times a day (TID) | ORAL | 0 refills | Status: DC | PRN

## 2019-06-20 MED ORDER — FENTANYL CITRATE (PF) 100 MCG/2ML IJ SOLN: 25.0000 ug | INTRAMUSCULAR | Status: DC | PRN

## 2019-06-20 MED ORDER — CEFAZOLIN SODIUM-DEXTROSE 2-4 GM/100ML-% IV SOLN
INTRAVENOUS | Status: AC
  Filled 2019-06-20: qty 100

## 2019-06-20 MED ORDER — ONDANSETRON HCL 4 MG PO TABS: 4.0000 mg | ORAL_TABLET | Freq: Three times a day (TID) | ORAL | 0 refills | Status: DC | PRN

## 2019-06-20 MED ORDER — OXYCODONE HCL 5 MG/5ML PO SOLN: 5.0000 mg | Freq: Once | ORAL | Status: DC | PRN

## 2019-06-20 MED ORDER — FENTANYL CITRATE (PF) 100 MCG/2ML IJ SOLN
INTRAMUSCULAR | Status: AC
  Filled 2019-06-20: qty 2

## 2019-06-20 MED ORDER — MIDAZOLAM HCL 2 MG/2ML IJ SOLN
1.0000 mg | INTRAMUSCULAR | Status: DC | PRN
  Administered 2019-06-20: 2 mg via INTRAVENOUS

## 2019-06-20 MED ORDER — CEFAZOLIN SODIUM-DEXTROSE 2-4 GM/100ML-% IV SOLN
2.0000 g | INTRAVENOUS | Status: AC
  Administered 2019-06-20: 12:00:00 2 g via INTRAVENOUS

## 2019-06-20 MED ORDER — LIDOCAINE 2% (20 MG/ML) 5 ML SYRINGE
INTRAMUSCULAR | Status: DC | PRN
  Administered 2019-06-20: 50 mg via INTRAVENOUS

## 2019-06-20 MED ORDER — FENTANYL CITRATE (PF) 100 MCG/2ML IJ SOLN
50.0000 ug | INTRAMUSCULAR | Status: DC | PRN
  Administered 2019-06-20: 100 ug via INTRAVENOUS

## 2019-06-20 SURGICAL SUPPLY — 69 items
BIT DRILL MINI LNG ACUTRAK 2 (BIT) IMPLANT
BLADE MINI RND TIP GREEN BEAV (BLADE) IMPLANT
BLADE SURG 15 STRL LF DISP TIS (BLADE) ×1 IMPLANT
BLADE SURG 15 STRL SS (BLADE) ×3
BNDG CMPR 9X4 STRL LF SNTH (GAUZE/BANDAGES/DRESSINGS) ×1
BNDG ELASTIC 2X5.8 VLCR STR LF (GAUZE/BANDAGES/DRESSINGS) ×2 IMPLANT
BNDG ELASTIC 3X5.8 VLCR STR LF (GAUZE/BANDAGES/DRESSINGS) ×3 IMPLANT
BNDG ESMARK 4X9 LF (GAUZE/BANDAGES/DRESSINGS) ×3 IMPLANT
BRUSH SCRUB EZ PLAIN DRY (MISCELLANEOUS) ×3 IMPLANT
CAP PIN PROTECTOR ORTHO WHT (CAP) IMPLANT
CORD BIPOLAR FORCEPS 12FT (ELECTRODE) ×2 IMPLANT
COVER BACK TABLE 60X90IN (DRAPES) ×3 IMPLANT
COVER MAYO STAND STRL (DRAPES) ×1 IMPLANT
COVER WAND RF STERILE (DRAPES) IMPLANT
CUFF TOURN SGL QUICK 18X4 (TOURNIQUET CUFF) ×2 IMPLANT
DECANTER SPIKE VIAL GLASS SM (MISCELLANEOUS) IMPLANT
DRAPE EXTREMITY T 121X128X90 (DISPOSABLE) ×3 IMPLANT
DRAPE IMP U-DRAPE 54X76 (DRAPES) ×2 IMPLANT
DRAPE OEC MINIVIEW 54X84 (DRAPES) ×3 IMPLANT
DRAPE SURG 17X23 STRL (DRAPES) ×3 IMPLANT
DRILL MINI LNG ACUTRAK 2 (BIT) ×3
GAUZE SPONGE 4X4 12PLY STRL (GAUZE/BANDAGES/DRESSINGS) ×3 IMPLANT
GAUZE XEROFORM 1X8 LF (GAUZE/BANDAGES/DRESSINGS) ×3 IMPLANT
GLOVE BIO SURGEON STRL SZ 6.5 (GLOVE) ×1 IMPLANT
GLOVE BIO SURGEONS STRL SZ 6.5 (GLOVE) ×1
GLOVE BIOGEL PI IND STRL 6.5 (GLOVE) IMPLANT
GLOVE BIOGEL PI IND STRL 7.0 (GLOVE) ×1 IMPLANT
GLOVE BIOGEL PI INDICATOR 6.5 (GLOVE) ×2
GLOVE BIOGEL PI INDICATOR 7.0 (GLOVE) ×4
GLOVE ECLIPSE 7.0 STRL STRAW (GLOVE) ×3 IMPLANT
GLOVE SKINSENSE NS SZ7.5 (GLOVE) ×2
GLOVE SKINSENSE STRL SZ7.5 (GLOVE) ×1 IMPLANT
GLOVE SURG SYN 7.5  E (GLOVE) ×6
GLOVE SURG SYN 7.5 E (GLOVE) ×2 IMPLANT
GLOVE SURG SYN 7.5 PF PI (GLOVE) ×1 IMPLANT
GOWN SRG XL LVL 4 BRTHBL STRL (GOWNS) IMPLANT
GOWN STRL NON-REIN XL LVL4 (GOWNS)
GOWN STRL REIN XL XLG (GOWN DISPOSABLE) ×3 IMPLANT
GOWN STRL REUS W/ TWL LRG LVL3 (GOWN DISPOSABLE) ×1 IMPLANT
GOWN STRL REUS W/ TWL XL LVL3 (GOWN DISPOSABLE) ×1 IMPLANT
GOWN STRL REUS W/TWL LRG LVL3 (GOWN DISPOSABLE) ×3
GOWN STRL REUS W/TWL XL LVL3 (GOWN DISPOSABLE) ×3
GUIDEWIRE ORTHO MINI ACTK .045 (WIRE) ×2 IMPLANT
NEEDLE HYPO 22GX1.5 SAFETY (NEEDLE) IMPLANT
NS IRRIG 1000ML POUR BTL (IV SOLUTION) ×3 IMPLANT
PACK BASIN DAY SURGERY FS (CUSTOM PROCEDURE TRAY) ×3 IMPLANT
PAD CAST 3X4 CTTN HI CHSV (CAST SUPPLIES) ×1 IMPLANT
PADDING CAST COTTON 3X4 STRL (CAST SUPPLIES) ×3
PADDING CAST SYNTHETIC 2 (CAST SUPPLIES) ×2
PADDING CAST SYNTHETIC 2X4 NS (CAST SUPPLIES) IMPLANT
PADDING CAST SYNTHETIC 3 NS LF (CAST SUPPLIES) ×2
PADDING CAST SYNTHETIC 3X4 NS (CAST SUPPLIES) IMPLANT
PADDING CAST SYNTHETIC 4 (CAST SUPPLIES)
PADDING CAST SYNTHETIC 4X4 STR (CAST SUPPLIES) IMPLANT
SCREW ACUTRAK 2 MINI 18MM (Screw) ×2 IMPLANT
SLEEVE SCD COMPRESS KNEE MED (MISCELLANEOUS) ×1 IMPLANT
SLING ARM FOAM STRAP LRG (SOFTGOODS) ×2 IMPLANT
SPLINT FIBERGLASS 3X35 (CAST SUPPLIES) ×2 IMPLANT
SPLINT PLASTER CAST XFAST 4X15 (CAST SUPPLIES) IMPLANT
SPLINT PLASTER XTRA FAST SET 4 (CAST SUPPLIES)
STOCKINETTE 4X48 STRL (DRAPES) ×3 IMPLANT
SUT ETHILON 4 0 PS 2 18 (SUTURE) ×2 IMPLANT
SUT VIC AB 2-0 SH 27 (SUTURE) ×3
SUT VIC AB 2-0 SH 27XBRD (SUTURE) IMPLANT
SYR BULB 3OZ (MISCELLANEOUS) ×3 IMPLANT
SYR CONTROL 10ML LL (SYRINGE) IMPLANT
TOWEL GREEN STERILE FF (TOWEL DISPOSABLE) ×3 IMPLANT
TRAY DSU PREP LF (CUSTOM PROCEDURE TRAY) ×3 IMPLANT
UNDERPAD 30X36 HEAVY ABSORB (UNDERPADS AND DIAPERS) ×3 IMPLANT

## 2019-06-20 NOTE — Anesthesia Preprocedure Evaluation (Signed)
Anesthesia Evaluation  Patient identified by MRN, date of birth, ID band Patient awake    Reviewed: Allergy & Precautions, H&P , NPO status , Patient's Chart, lab work & pertinent test results  Airway Mallampati: II   Neck ROM: full    Dental   Pulmonary asthma , Current Smoker,    breath sounds clear to auscultation       Cardiovascular negative cardio ROS   Rhythm:regular Rate:Normal     Neuro/Psych PSYCHIATRIC DISORDERS Anxiety Depression    GI/Hepatic   Endo/Other    Renal/GU      Musculoskeletal   Abdominal   Peds  Hematology   Anesthesia Other Findings   Reproductive/Obstetrics                             Anesthesia Physical Anesthesia Plan  ASA: II  Anesthesia Plan: Regional and MAC   Post-op Pain Management:    Induction: Intravenous  PONV Risk Score and Plan: Propofol infusion, Ondansetron, Midazolam and Treatment may vary due to age or medical condition  Airway Management Planned: Simple Face Mask  Additional Equipment:   Intra-op Plan:   Post-operative Plan:   Informed Consent: I have reviewed the patients History and Physical, chart, labs and discussed the procedure including the risks, benefits and alternatives for the proposed anesthesia with the patient or authorized representative who has indicated his/her understanding and acceptance.       Plan Discussed with: CRNA, Anesthesiologist and Surgeon  Anesthesia Plan Comments:         Anesthesia Quick Evaluation

## 2019-06-20 NOTE — Anesthesia Procedure Notes (Signed)
Anesthesia Regional Block: Supraclavicular block   Pre-Anesthetic Checklist: ,, timeout performed, Correct Patient, Correct Site, Correct Laterality, Correct Procedure, Correct Position, site marked, Risks and benefits discussed,  Surgical consent,  Pre-op evaluation,  At surgeon's request and post-op pain management  Laterality: Right  Prep: chloraprep       Needles:  Injection technique: Single-shot  Needle Type: Echogenic Stimulator Needle     Needle Length: 5cm  Needle Gauge: 22     Additional Needles:   Procedures:, nerve stimulator,,,,,,,   Nerve Stimulator or Paresthesia:  Response: biceps flexion, 0.45 mA,   Additional Responses:   Narrative:  Start time: 06/20/2019 11:50 AM End time: 06/20/2019 11:57 AM Injection made incrementally with aspirations every 5 mL.  Performed by: Personally  Anesthesiologist: Achille Rich, MD  Additional Notes: Functioning IV was confirmed and monitors were applied.  A 36mm 22ga Arrow echogenic stimulator needle was used. Sterile prep and drape,hand hygiene and sterile gloves were used.  Negative aspiration and negative test dose prior to incremental administration of local anesthetic. The patient tolerated the procedure well.  Ultrasound guidance: relevent anatomy identified, needle position confirmed, local anesthetic spread visualized around nerve(s), vascular puncture avoided.  Image printed for medical record.

## 2019-06-20 NOTE — H&P (Signed)
H&P update  The surgical history has been reviewed and remains accurate without interval change.  The patient was re-examined and patient's physiologic condition has not changed significantly in the last 30 days. The condition still exists that makes this procedure necessary. The treatment plan remains the same, without new options for care.  No new pharmacological allergies or types of therapy has been initiated that would change the plan or the appropriateness of the plan.  The patient and/or family understand the potential benefits and risks.  N. Michael Tahisha Hakim, MD 06/20/2019 6:16 AM    

## 2019-06-20 NOTE — Progress Notes (Signed)
Assisted Dr. Hodierne with right, ultrasound guided, interscalene  block. Side rails up, monitors on throughout procedure. See vital signs in flow sheet. Tolerated Procedure well. 

## 2019-06-20 NOTE — Transfer of Care (Signed)
Immediate Anesthesia Transfer of Care Note  Patient: [name]  Procedure(s) Performed: RIGHT SCAPHOID PERCUTANEOUS FIXATION (Right Hand)  Patient Location: PACU  Anesthesia Type:MAC and Regional  Level of Consciousness: awake and alert   Airway & Oxygen Therapy: Patient Spontanous Breathing and Patient connected to nasal cannula oxygen  Post-op Assessment: Report given to RN and Post -op Vital signs reviewed and stable  Post vital signs: Reviewed and stable  Last Vitals:  Vitals Value Taken Time  BP 140/83 06/20/19 1307  Temp    Pulse 51 06/20/19 1308  Resp 6 06/20/19 1308  SpO2 100 % 06/20/19 1308  Vitals shown include unvalidated device data.  Last Pain:  Vitals:   06/20/19 1034  TempSrc: Oral  PainSc: 1       Patients Stated Pain Goal: 6 (34/37/35 7897)  Complications: No apparent anesthesia complications

## 2019-06-20 NOTE — Op Note (Signed)
   Date of Surgery: 06/20/2019  INDICATIONS: Mr. Dorton is a 29 y.o.-year-old male with a right scaphoid fracture from a mechanical fall;  The patient did consent to the procedure after discussion of the risks and benefits.  PREOPERATIVE DIAGNOSIS: Nondisplaced right scaphoid waist fracture  POSTOPERATIVE DIAGNOSIS: Same.  PROCEDURE: Open reduction internal fixation of right scaphoid waist fracture  SURGEON: N. Eduard Roux, M.D.  ASSIST: Ciro Backer [place], [place]; necessary for the timely completion of procedure and due to complexity of procedure.  ANESTHESIA:  Supraclavicular block, MAC  IV FLUIDS AND URINE: See anesthesia.  ESTIMATED BLOOD LOSS: minimal mL.  IMPLANTS: Acutrak 2 scaphoid screw, 18 mm standard  DRAINS: none  COMPLICATIONS: see description of procedure.  DESCRIPTION OF PROCEDURE: The patient was brought to the operating room.  The patient had been signed prior to the procedure and this was documented. The patient had the anesthesia placed by the anesthesiologist.  A time-out was performed to confirm that this was the correct patient, site, side and location. The patient did receive antibiotics prior to the incision and was re-dosed during the procedure as needed at indicated intervals.  A tourniquet supine placed.  The patient had the operative extremity prepped and draped in the standard surgical fashion.    A 1 inch incision was made on the dorsal aspect of the distal radius just ulnar to Lister's tubercle.  Blunt dissection was carried down onto the extensor retinaculum.  The distal portion of the extensor retinaculum was incised in order to reveal the EPL tendon and the ECRL and ECRB tendons.  The tendons were retracted and using fluoroscopic guidance a K wire was placed at the proximal pole of the scaphoid.  The start site was confirmed under on orthogonal views.  The K wire was then advanced from the proximal pole into the distal pole using fluoroscopic guidance.   Once the K wire was at the appropriate depth the length was measured to be 22 mm.  We took 4 mm off of this measurement and decided to use a 18 mm screw.  The K wire was then advanced into the trapezium to prevent backing out during drilling.  A cannulated drill bit was advanced over the K wire to the appropriate depth and then backed out.  The 18 mm AccuTrack screw was then advanced over the K wire by hand to the appropriate depth which was then checked on orthogonal views.  The screw had excellent purchase.  The fracture remained nondisplaced and I was able to visualize compression across the fracture.  A K wire was then removed.  The surgical wound was thoroughly irrigated.  The retinaculum was repaired with 2-0 Vicryl.  Hemostasis was obtained.  Layer closure was performed.  Sterile dressings were applied.  Thumb spica splint was placed.  Patient tolerated procedure well had no immediate complications.  POSTOPERATIVE PLAN: Discharge home and follow-up in 1 week for repeat x-rays and initiation of range of motion.  We will place him in a thumb spica Velcro brace at his first follow-up.  Azucena Cecil, MD 3:27 PM

## 2019-06-20 NOTE — Discharge Instructions (Signed)
Postoperative instructions:  Weightbearing instructions: non weight bearing  Dressing instructions: Keep your dressing and/or splint clean and dry at all times.  It will be removed at your first post-operative appointment.  Your stitches and/or staples will be removed at this visit.  Incision instructions:  Do not soak your incision for 3 weeks after surgery.  If the incision gets wet, pat dry and do not scrub the incision.  Pain control:  You have been given a prescription to be taken as directed for post-operative pain control.  In addition, elevate the operative extremity above the heart at all times to prevent swelling and throbbing pain.  Take over-the-counter Colace, 100mg  by mouth twice a day while taking narcotic pain medications to help prevent constipation.  Follow up appointments: 1) 7 days for wound check. 2) Dr. as scheduled.   -------------------------------------------------------------------------------------------------------------  After Surgery Pain Control:  After your surgery, post-surgical discomfort or pain is likely. This discomfort can last several days to a few weeks. At certain times of the day your discomfort may be more intense.  Did you receive a nerve block?  A nerve block can provide pain relief for one hour to two days after your surgery. As long as the nerve block is working, you will experience little or no sensation in the area the surgeon operated on.  As the nerve block wears off, you will begin to experience pain or discomfort. It is very important that you begin taking your prescribed pain medication before the nerve block fully wears off. Treating your pain at the first sign of the block wearing off will ensure your pain is better controlled and more tolerable when full-sensation returns. Do not wait until the pain is intolerable, as the medicine will be less effective. It is better to treat pain in advance than to try and catch up.  General  Anesthesia:  If you did not receive a nerve block during your surgery, you will need to start taking your pain medication shortly after your surgery and should continue to do so as prescribed by your surgeon.  Pain Medication:  Most commonly we prescribe Vicodin and Percocet for post-operative pain. Both of these medications contain a combination of acetaminophen (Tylenol) and a narcotic to help control pain.   It takes between 30 and 45 minutes before pain medication starts to work. It is important to take your medication before your pain level gets too intense.   Nausea is a common side effect of many pain medications. You will want to eat something before taking your pain medicine to help prevent nausea.   If you are taking a prescription pain medication that contains acetaminophen, we recommend that you do not take additional over the counter acetaminophen (Tylenol).  Other pain relieving options:   Using a cold pack to ice the affected area a few times a day (15 to 20 minutes at a time) can help to relieve pain, reduce swelling and bruising.   Elevation of the affected area can also help to reduce pain and swelling.  Regional Anesthesia Blocks  1. Numbness or the inability to move the "blocked" extremity may last from 3-48 hours after placement. The length of time depends on the medication injected and your individual response to the medication. If the numbness is not going away after 48 hours, call your surgeon.  2. The extremity that is blocked will need to be protected until the numbness is gone and the  Strength has returned. Because you  cannot feel it, you will need to take extra care to avoid injury. Because it may be weak, you may have difficulty moving it or using it. You may not know what position it is in without looking at it while the block is in effect.  3. For blocks in the legs and feet, returning to weight bearing and walking needs to be done carefully. You will need to  wait until the numbness is entirely gone and the strength has returned. You should be able to move your leg and foot normally before you try and bear weight or walk. You will need someone to be with you when you first try to ensure you do not fall and possibly risk injury.  4. Bruising and tenderness at the needle site are common side effects and will resolve in a few days.  5. Persistent numbness or new problems with movement should be communicated to the surgeon or the [place] [phone] [place] [phone]).  Post Anesthesia Home Care Instructions  Activity: Get plenty of rest for the remainder of the day. A responsible individual must stay with you for 24 hours following the procedure.  For the next 24 hours, DO NOT: -Drive a car -Paediatric nurse -Drink alcoholic beverages -Take any medication unless instructed by your physician -Make any legal decisions or sign important papers.  Meals: Start with liquid foods such as gelatin or soup. Progress to regular foods as tolerated. Avoid greasy, spicy, heavy foods. If nausea and/or vomiting occur, drink only clear liquids until the nausea and/or vomiting subsides. Call your physician if vomiting continues.  Special Instructions/Symptoms: Your throat may feel dry or sore from the anesthesia or the breathing tube placed in your throat during surgery. If this causes discomfort, gargle with warm salt water. The discomfort should disappear within 24 hours.  If you had a scopolamine patch placed behind your ear for the management of post- operative nausea and/or vomiting:  1. The medication in the patch is effective for 72 hours, after which it should be removed.  Wrap patch in a tissue and discard in the trash. Wash hands thoroughly with soap and water. 2. You may remove the patch earlier than 72 hours if you experience unpleasant side effects which may include dry mouth, dizziness or visual  disturbances. 3. Avoid touching the patch. Wash your hands with soap and water after contact with the patch.

## 2019-06-21 ENCOUNTER — Encounter: Payer: Self-pay | Admitting: *Deleted

## 2019-06-21 NOTE — Anesthesia Postprocedure Evaluation (Signed)
Anesthesia Post Note  Patient: [name]  Procedure(s) Performed: RIGHT SCAPHOID PERCUTANEOUS FIXATION (Right Hand)     Patient location during evaluation: PACU Anesthesia Type: Regional and MAC Level of consciousness: awake and alert Pain management: pain level controlled Vital Signs Assessment: post-procedure vital signs reviewed and stable Respiratory status: spontaneous breathing, nonlabored ventilation, respiratory function stable and patient connected to nasal cannula oxygen Cardiovascular status: stable and blood pressure returned to baseline Postop Assessment: no apparent nausea or vomiting Anesthetic complications: no    Last Vitals:  Vitals:   06/20/19 1330 06/20/19 1344  BP: 109/80 116/74  Pulse: (!) 43 (!) 59  Resp: 12 12  Temp:  36.4 C  SpO2: 98% 99%    Last Pain:  Vitals:   06/20/19 1344  TempSrc: Oral  PainSc: 0-No pain                 Remmy Riffe S

## 2019-06-28 ENCOUNTER — Encounter: Payer: Self-pay | Admitting: Orthopaedic Surgery

## 2019-06-28 ENCOUNTER — Ambulatory Visit (INDEPENDENT_AMBULATORY_CARE_PROVIDER_SITE_OTHER): Payer: Self-pay | Admitting: Orthopaedic Surgery

## 2019-06-28 ENCOUNTER — Other Ambulatory Visit: Payer: Self-pay

## 2019-06-28 ENCOUNTER — Ambulatory Visit (INDEPENDENT_AMBULATORY_CARE_PROVIDER_SITE_OTHER): Payer: Self-pay

## 2019-06-28 DIAGNOSIS — S62024A Nondisplaced fracture of middle third of navicular [scaphoid] bone of right wrist, initial encounter for closed fracture: Secondary | ICD-10-CM

## 2019-06-28 MED ORDER — OXYCODONE-ACETAMINOPHEN 5-325 MG PO TABS: 1.0000 | ORAL_TABLET | Freq: Two times a day (BID) | ORAL | 0 refills | Status: DC | PRN

## 2019-06-28 NOTE — Progress Notes (Signed)
Post-Op Visit Note   Patient: [name]           Date of Birth: 1990/09/20           MRN: [id number] Visit Date: 06/28/2019 PCP: Default, Provider, MD   Assessment & Plan:  Chief Complaint:  Chief Complaint  Patient presents with  . Right Wrist - Routine Post Op, Pain   Visit Diagnoses:  1. Closed nondisplaced fracture of middle third of scaphoid bone of right wrist, initial encounter     Plan: Patient is a pleasant 29 year old gentleman who comes in today 1 week status post ORIF right scaphoid fracture 06/20/2019.  He has been doing well.  He has been compliant nonweightbearing right upper extremity.  He has minimal to moderate pain which is relieved with narcotic pain medication.  Examination of his right hand without the splint reveals a well-healing surgical incision with nylon sutures in place.  No evidence of infection or cellulitis.  He does have mild swelling to the right hand.  Fingers are warm and well-perfused.  At this point, his wound was covered and he was placed in a removable thumb spica splint.  We will start him in hand therapy for gentle range of motion as tolerated.  He will be nonweightbearing, however.  He will follow-up with [place] in 1 week's time for suture removal.  Call with concerns or questions in the meantime.  Follow-Up Instructions: Return in about 1 week (around 07/05/2019) for suture removal.   Orders:  Orders Placed This Encounter  Procedures  . XR Wrist Complete Right  . Ambulatory referral to Physical Therapy   Meds ordered this encounter  Medications  . oxyCODONE-acetaminophen (PERCOCET) 5-325 MG tablet    Sig: Take 1-2 tablets by mouth 2 (two) times daily as needed for severe pain.    Dispense:  30 tablet    Refill:  0    Imaging: No results found.  PMFS History: Patient Active Problem List   Diagnosis Date Noted  . Closed nondisplaced fracture of middle third of navicular bone of right wrist 06/15/2019  . Panic disorder 06/07/2014    . Adjustment disorder with depressed mood 06/07/2014  . Cannabis use disorder, moderate, dependence ([place]) 06/07/2014  . Tobacco use disorder 06/07/2014   Past Medical History:  Diagnosis Date  . Anxiety    no current med.  . Asthma    triggered by cold weather; prn inhaler  . Depression   . Fracture of metacarpal 02/13/2012   right small  . Psoriasis    right foot    History reviewed. No pertinent family history.  Past Surgical History:  Procedure Laterality Date  . CLOSED REDUCTION FINGER WITH PERCUTANEOUS PINNING  02/16/2012   Procedure: CLOSED REDUCTION FINGER WITH PERCUTANEOUS PINNING;  Surgeon: Schuyler Amor, MD;  Location: [place] [facility];  Service: Orthopedics;  Laterality: Right;  CLOSED REDUCTION PINNING RT SMALL METACARPAL FX.  Marland Kitchen PERCUTANEOUS PINNING Right 06/20/2019   Procedure: RIGHT SCAPHOID PERCUTANEOUS FIXATION;  Surgeon: Leandrew Koyanagi, MD;  Location: [place];  Service: Orthopedics;  Laterality: Right;  MAC  . TONSILLECTOMY  age 25   Social History   Occupational History  . Not on file  Tobacco Use  . Smoking status: Current Every Day Smoker    Packs/day: 1.00    Years: 6.00    Pack years: 6.00    Types: Cigarettes  . Smokeless tobacco: Never Used  Substance and Sexual Activity  . Alcohol  use: Yes    Comment: "couple times a week" "1-6 beers" last drink was on Tuesday. 06/07/14  . Drug use: Yes    Types: Marijuana  . Sexual activity: Never

## 2019-07-05 ENCOUNTER — Ambulatory Visit (INDEPENDENT_AMBULATORY_CARE_PROVIDER_SITE_OTHER): Payer: Self-pay | Admitting: Orthopaedic Surgery

## 2019-07-05 ENCOUNTER — Other Ambulatory Visit: Payer: Self-pay

## 2019-07-05 DIAGNOSIS — S62024A Nondisplaced fracture of middle third of navicular [scaphoid] bone of right wrist, initial encounter for closed fracture: Secondary | ICD-10-CM

## 2019-07-05 NOTE — Progress Notes (Signed)
   Post-Op Visit Note   Patient: [name]           Date of Birth: 08/19/1990           MRN: [id number] Visit Date: 07/05/2019 PCP: Default, Provider, MD   Assessment & Plan:  Chief Complaint:  Chief Complaint  Patient presents with  . Right Wrist - Pain, Follow-up   Visit Diagnoses:  1. Closed nondisplaced fracture of middle third of scaphoid bone of right wrist, initial encounter     Plan: [name] comes in today for suture removal.  He is doing well.  Sutures were removed without difficulty.  Continue wearing the thumb spica brace.  I have made a referral to begin hand and wrist rehab for gentle range of motion and mobilization.  Recheck in 4 weeks with repeat x-rays of the wrist including scaphoid view  Follow-Up Instructions: Return in about 4 weeks (around 08/02/2019).   Orders:  Orders Placed This Encounter  Procedures  . Ambulatory referral to Occupational Therapy   No orders of the defined types were placed in this encounter.   Imaging: No results found.  PMFS History: Patient Active Problem List   Diagnosis Date Noted  . Closed nondisplaced fracture of middle third of navicular bone of right wrist 06/15/2019  . Panic disorder 06/07/2014  . Adjustment disorder with depressed mood 06/07/2014  . Cannabis use disorder, moderate, dependence ([place]) 06/07/2014  . Tobacco use disorder 06/07/2014   Past Medical History:  Diagnosis Date  . Anxiety    no current med.  . Asthma    triggered by cold weather; prn inhaler  . Depression   . Fracture of metacarpal 02/13/2012   right small  . Psoriasis    right foot    No family history on file.  Past Surgical History:  Procedure Laterality Date  . CLOSED REDUCTION FINGER WITH PERCUTANEOUS PINNING  02/16/2012   Procedure: CLOSED REDUCTION FINGER WITH PERCUTANEOUS PINNING;  Surgeon: Schuyler Amor, MD;  Location: [facility];  Service: Orthopedics;  Laterality: Right;  CLOSED REDUCTION PINNING RT  SMALL METACARPAL FX.  Marland Kitchen PERCUTANEOUS PINNING Right 06/20/2019   Procedure: RIGHT SCAPHOID PERCUTANEOUS FIXATION;  Surgeon: Leandrew Koyanagi, MD;  Location: [place];  Service: Orthopedics;  Laterality: Right;  MAC  . TONSILLECTOMY  age 1   Social History   Occupational History  . Not on file  Tobacco Use  . Smoking status: Current Every Day Smoker    Packs/day: 1.00    Years: 6.00    Pack years: 6.00    Types: Cigarettes  . Smokeless tobacco: Never Used  Substance and Sexual Activity  . Alcohol use: Yes    Comment: "couple times a week" "1-6 beers" last drink was on Tuesday. 06/07/14  . Drug use: Yes    Types: Marijuana  . Sexual activity: Never

## 2019-08-02 ENCOUNTER — Ambulatory Visit: Payer: Self-pay | Admitting: Orthopaedic Surgery

## 2020-07-09 IMAGING — DX DG WRIST COMPLETE 3+V*R*
4 series · 4 of 4 positions shown · non-contrast
Comparison: None.

CLINICAL DATA: Pain after fall

EXAM:
RIGHT WRIST - COMPLETE 3+ VIEW

[wrist pa]
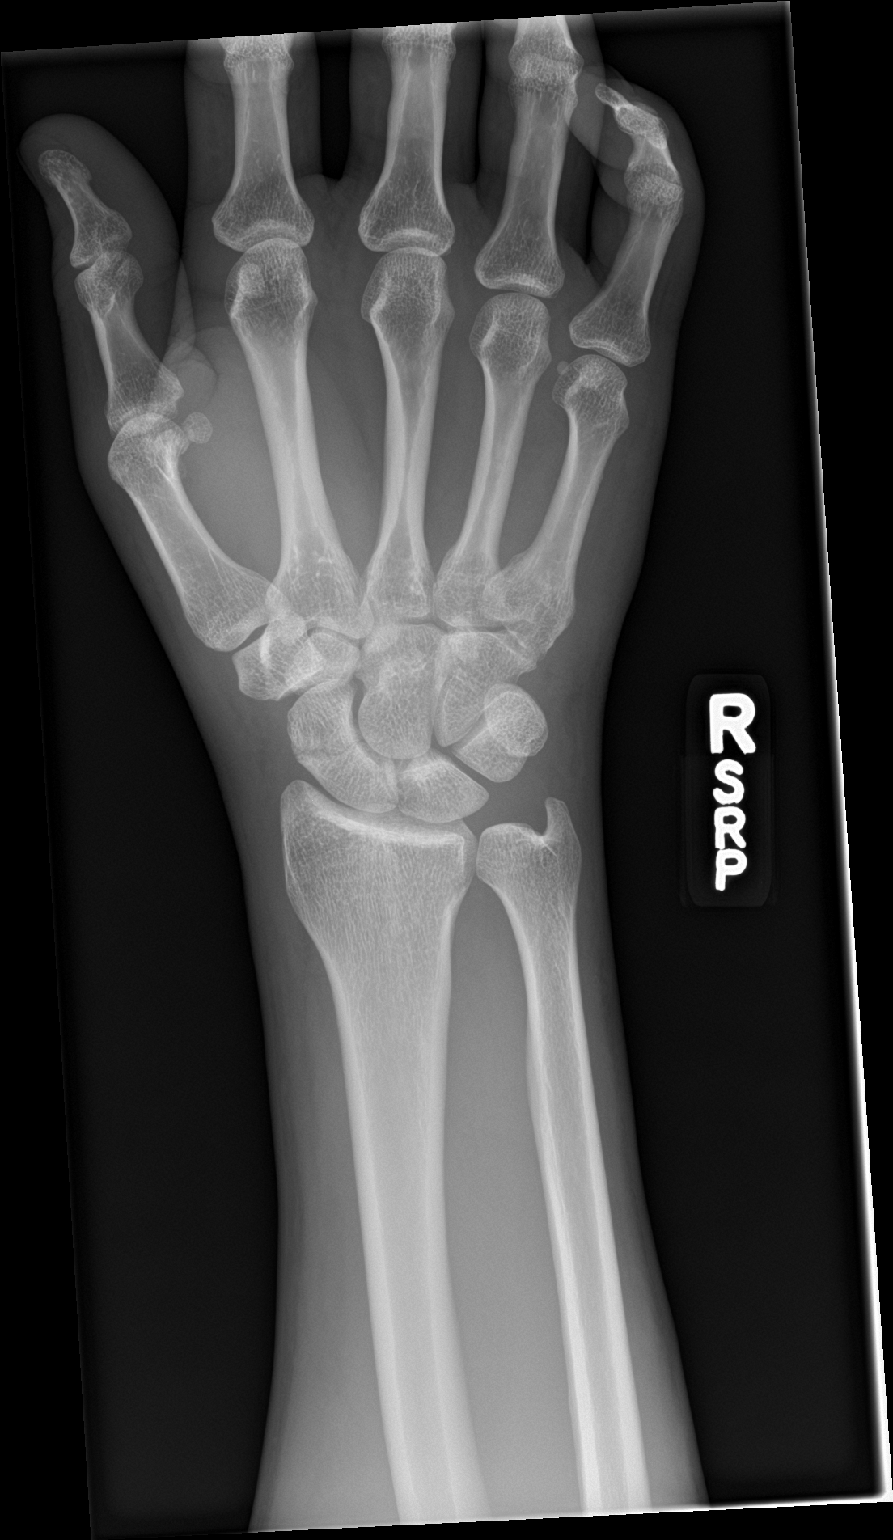

[wrist navicular]
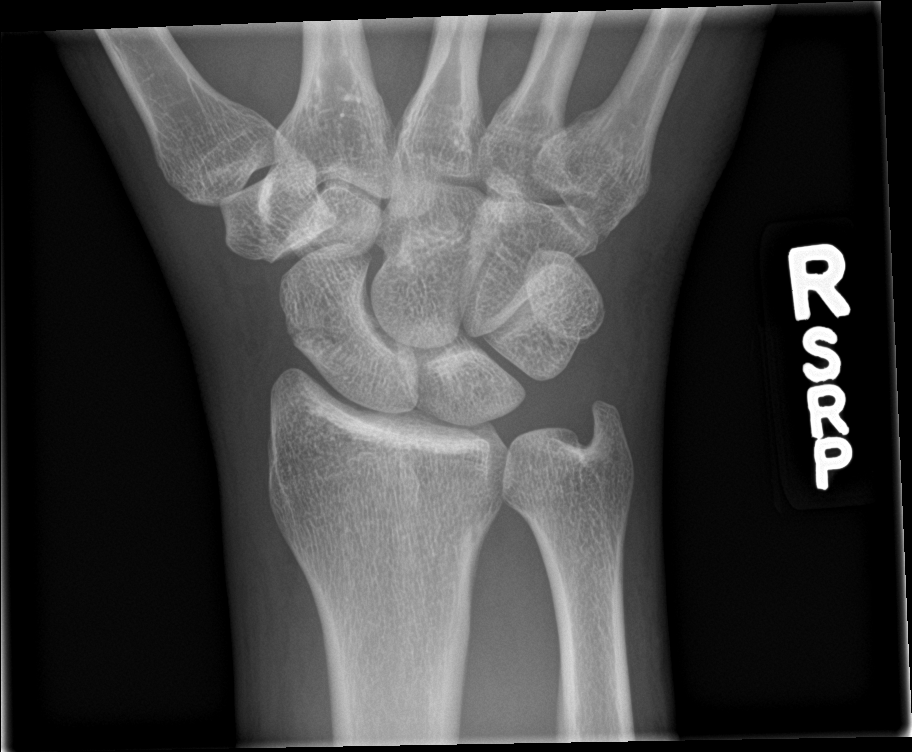

[wrist obl]
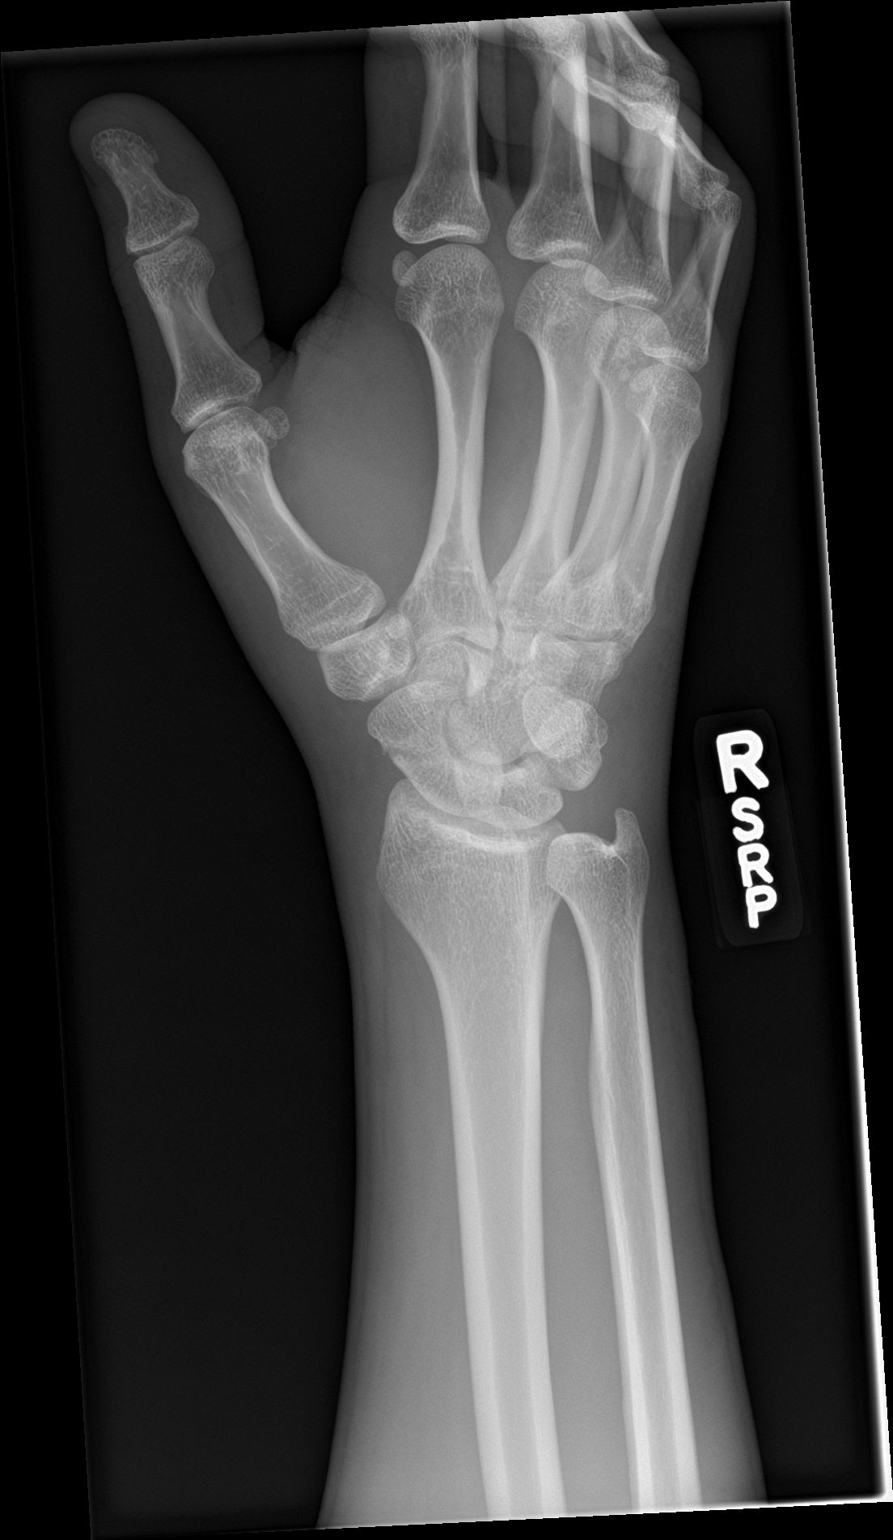

[wrist lat]
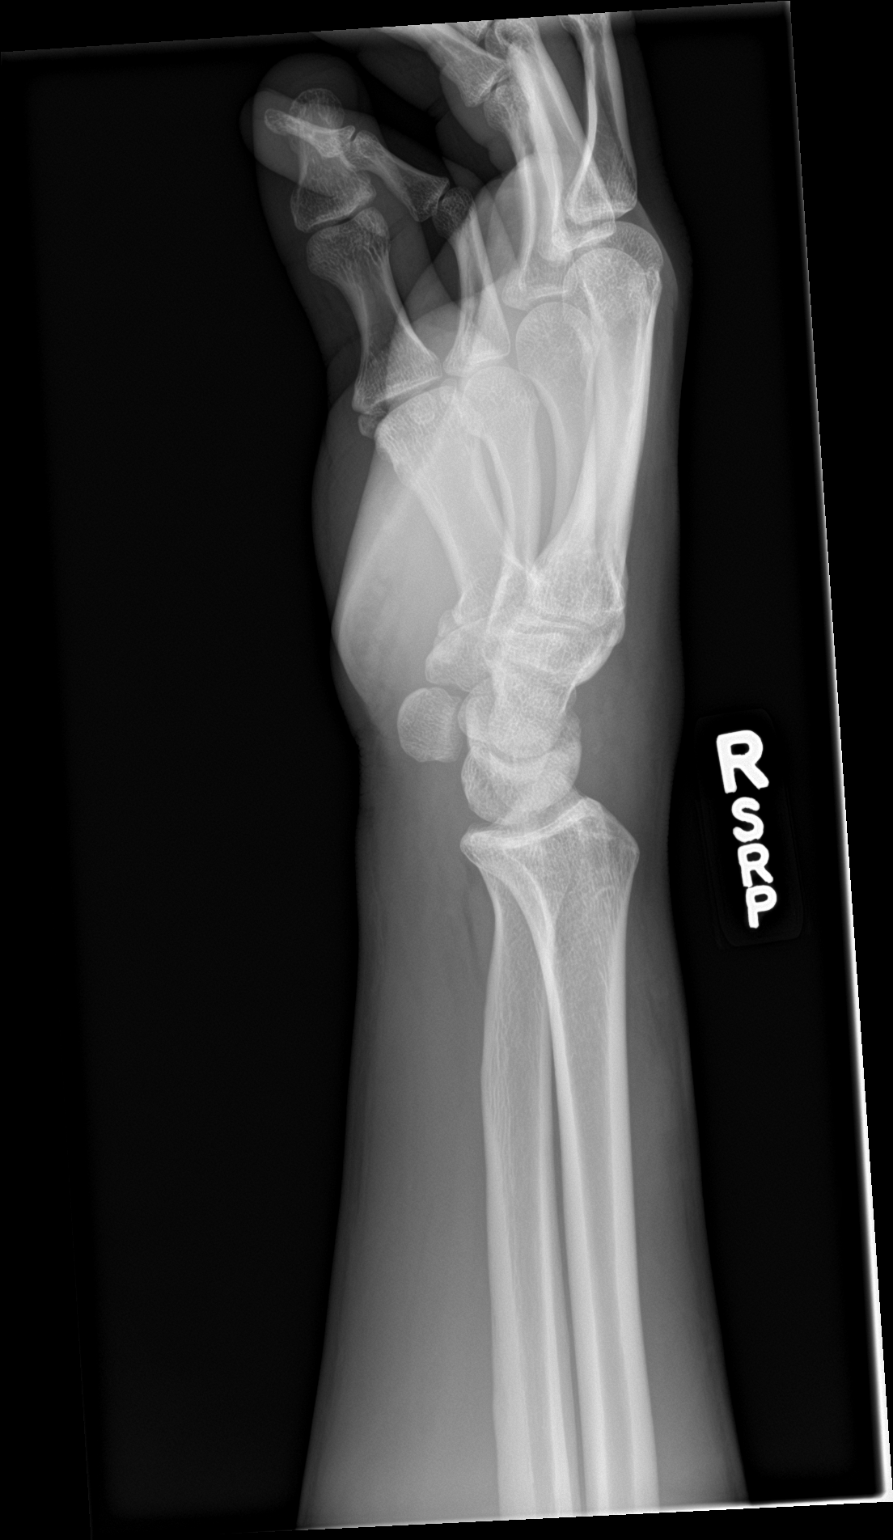

[4 of 4 positions shown; findings below may reference images not displayed]

FINDINGS: Frontal, oblique, lateral, and ulnar deviation scaphoid images were
obtained. There is a transversely oriented fracture through the
midportion of the scaphoid with alignment essentially anatomic. No
other fracture. No dislocation. Joint spaces appear normal. No
erosive change.
IMPRESSION: Transversely oriented fracture of the mid scaphoid bone with
alignment essentially anatomic. No other fracture evident. No
dislocation. No evident arthropathy.

These results will be called to the ordering clinician or
representative by the Radiologist Assistant, and communication
documented in the PACS or [REDACTED].

## 2020-08-27 ENCOUNTER — Ambulatory Visit (HOSPITAL_COMMUNITY)
Admission: EM | Admit: 2020-08-27 | Discharge: 2020-08-27 | Disposition: A | Discharge status: Home / Self Care | Payer: Self-pay | Attending: Emergency Medicine | Admitting: Emergency Medicine

## 2020-08-27 ENCOUNTER — Encounter (HOSPITAL_COMMUNITY): Payer: Self-pay

## 2020-08-27 DIAGNOSIS — Z7952 Long term (current) use of systemic steroids: Secondary | ICD-10-CM | POA: Insufficient documentation

## 2020-08-27 DIAGNOSIS — R059 Cough, unspecified: Secondary | ICD-10-CM

## 2020-08-27 DIAGNOSIS — J40 Bronchitis, not specified as acute or chronic: Secondary | ICD-10-CM | POA: Insufficient documentation

## 2020-08-27 DIAGNOSIS — Z20822 Contact with and (suspected) exposure to covid-19: Secondary | ICD-10-CM | POA: Insufficient documentation

## 2020-08-27 DIAGNOSIS — R519 Headache, unspecified: Secondary | ICD-10-CM | POA: Insufficient documentation

## 2020-08-27 DIAGNOSIS — F17210 Nicotine dependence, cigarettes, uncomplicated: Secondary | ICD-10-CM | POA: Insufficient documentation

## 2020-08-27 DIAGNOSIS — F1220 Cannabis dependence, uncomplicated: Secondary | ICD-10-CM | POA: Insufficient documentation

## 2020-08-27 DIAGNOSIS — R5381 Other malaise: Secondary | ICD-10-CM | POA: Insufficient documentation

## 2020-08-27 MED ORDER — BENZONATATE 100 MG PO CAPS: 100.0000 mg | ORAL_CAPSULE | Freq: Three times a day (TID) | ORAL | 0 refills | Status: AC | PRN

## 2020-08-27 MED ORDER — PREDNISONE 10 MG (21) PO TBPK: ORAL_TABLET | ORAL | 0 refills | Status: DC

## 2020-08-27 NOTE — ED Triage Notes (Signed)
Pt c/o trouble breathing, congestion, dizziness, generalized muscle pain, headache. SOB with activity. Breathing unlabored, chest expansion symmetrical.  Started 6 days ago  Taken Mucinex- no relief

## 2020-08-27 NOTE — ED Provider Notes (Signed)
[facility]  ____________________________________________  Time seen: Approximately 12:47 PM  I have reviewed the triage vital signs and the nursing notes.   HISTORY  Chief Complaint Shortness of Breath, Headache, and Generalized Body Aches   Historian Patient    HPI [name] is a 30 y.o. male presents to the urgent care with cough, nasal congestion and malaise for the past 5 to 6 days.  He states that he has taken several at home COVID-19 test.  No chest pain or chest tightness.  Patient states that he had to leave work today due to cough and congestion.  Patient reports low-grade fever when symptoms started 5 to 6 days ago but no recent fever.   Past Medical History:  Diagnosis Date   Anxiety    no current med.   Asthma    triggered by cold weather; prn inhaler   Depression    Fracture of metacarpal 02/13/2012   right small   Psoriasis    right foot     Immunizations up to date:  Yes.     Past Medical History:  Diagnosis Date   Anxiety    no current med.   Asthma    triggered by cold weather; prn inhaler   Depression    Fracture of metacarpal 02/13/2012   right small   Psoriasis    right foot    Patient Active Problem List   Diagnosis Date Noted   Closed nondisplaced fracture of middle third of navicular bone of right wrist 06/15/2019   Panic disorder 06/07/2014   Adjustment disorder with depressed mood 06/07/2014   Cannabis use disorder, moderate, dependence ([place]) 06/07/2014   Tobacco use disorder 06/07/2014    Past Surgical History:  Procedure Laterality Date   CLOSED REDUCTION FINGER WITH PERCUTANEOUS PINNING  02/16/2012   Procedure: CLOSED REDUCTION FINGER WITH PERCUTANEOUS PINNING;  Surgeon: Schuyler Amor, MD;  Location: [facility];  Service: Orthopedics;  Laterality: Right;  CLOSED REDUCTION PINNING RT SMALL METACARPAL FX.   PERCUTANEOUS PINNING Right 06/20/2019   Procedure: RIGHT SCAPHOID PERCUTANEOUS  FIXATION;  Surgeon: Leandrew Koyanagi, MD;  Location: [facility];  Service: Orthopedics;  Laterality: Right;  MAC   TONSILLECTOMY  age 33    Prior to Admission medications   Medication Sig Start Date End Date Taking? Authorizing Provider  benzonatate (TESSALON PERLES) 100 MG capsule Take 1 capsule (100 mg total) by mouth 3 (three) times daily as needed for up to 7 days for cough. 08/27/20 09/03/20 Yes Vallarie Mare M, PA-C  predniSONE (STERAPRED UNI-PAK 21 TAB) 10 MG (21) TBPK tablet 6,5,4,3,2,1 08/27/20  Yes Sherral Hammers, Ellison Hughs M, PA-C  cetirizine (ZYRTEC) 10 MG tablet Take 10 mg by mouth daily.    [provider]  ondansetron (ZOFRAN) 4 MG tablet Take 1-2 tablets (4-8 mg total) by mouth every 8 (eight) hours as needed for nausea or vomiting. 06/20/19   Leandrew Koyanagi, MD  oxyCODONE-acetaminophen (PERCOCET) 5-325 MG tablet Take 1-2 tablets by mouth 2 (two) times daily as needed for severe pain. 06/28/19   Aundra Dubin, PA-C  gabapentin (NEURONTIN) 300 MG capsule 1 tab po at bedtime 1st day, 1 tablet bid second day, then 1 tablet tid 10/23/15 06/09/19  Janne Napoleon, NP  venlafaxine XR (EFFEXOR-XR) 37.5 MG 24 hr capsule Take 1 capsule (37.5 mg total) by mouth daily with breakfast. 06/10/14 06/09/19  Kerrie Buffalo, NP    Allergies Patient has no known allergies.  Family History  Problem Relation Age of Onset   Healthy Mother    Healthy Father    COPD Maternal Grandmother     Social History Social History   Tobacco Use   Smoking status: Every Day    Packs/day: 1.00    Years: 6.00    Pack years: 6.00    Types: Cigarettes   Smokeless tobacco: Never  Vaping Use   Vaping Use: Never used  Substance Use Topics   Alcohol use: Yes    Comment: "couple times a week" "1-6 beers" last drink was on Tuesday. 06/07/14   Drug use: Yes    Types: Marijuana     Review of Systems  Constitutional: No fever/chills Eyes:  No discharge ENT: No upper respiratory complaints. Respiratory:  Patient has cough and congestion. Gastrointestinal:   No nausea, no vomiting.  No diarrhea.  No constipation. Musculoskeletal: Negative for musculoskeletal pain. Skin: Negative for rash, abrasions, lacerations, ecchymosis.   ____________________________________________   PHYSICAL EXAM:  VITAL SIGNS: ED Triage Vitals  Enc Vitals Group     BP 08/27/20 1209 112/65     Pulse Rate 08/27/20 1209 95     Resp 08/27/20 1209 17     Temp 08/27/20 1209 98.4 F (36.9 C)     Temp Source 08/27/20 1209 Oral     SpO2 08/27/20 1209 96 %     Weight --      Height --      Head Circumference --      Peak Flow --      Pain Score 08/27/20 1205 3     Pain Loc --      Pain Edu? --      Excl. in [place]? --      Constitutional: Alert and oriented. Well appearing and in no acute distress. Eyes: Conjunctivae are normal. PERRL. EOMI. Head: Atraumatic. ENT:      Ears: TMs are pearly.      Nose: No congestion/rhinnorhea.      Mouth/Throat: Mucous membranes are moist.  Neck: No stridor.  No cervical spine tenderness to palpation. Cardiovascular: Normal rate, regular rhythm. Normal S1 and S2.  Good peripheral circulation. Respiratory: Normal respiratory effort without tachypnea or retractions. Lungs CTAB. Good air entry to the bases with no decreased or absent breath sounds Gastrointestinal: Bowel sounds x 4 quadrants. Soft and nontender to palpation. No guarding or rigidity. No distention. Musculoskeletal: Full range of motion to all extremities. No obvious deformities noted Neurologic:  Normal for age. No gross focal neurologic deficits are appreciated.  Skin:  Skin is warm, dry and intact. No rash noted. Psychiatric: Mood and affect are normal for age. Speech and behavior are normal.   ____________________________________________   LABS (all labs ordered are listed, but only abnormal results are displayed)  Labs Reviewed  SARS CORONAVIRUS 2 (TAT 6-24 HRS)    ____________________________________________  EKG   ____________________________________________  RADIOLOGY   No results found.  ____________________________________________    PROCEDURES  Procedure(s) performed:     Procedures     Medications - No data to display   ____________________________________________   INITIAL IMPRESSION / ASSESSMENT AND PLAN / ED COURSE  Pertinent labs & imaging results that were available during my care of the patient were reviewed by me and considered in my medical decision making (see chart for details).       Assessment and plan Cough 30 year old male presents to the urgent care with cough for the past 5 to 6 days.  Vital signs are  reassuring at triage.  On physical exam, patient was alert, active and nontoxic-appearing with no increased work of breathing or adventitious lung sounds.  Low suspicion for post viral pneumonia given absence of fever.  We will treat for bronchitis with tapered steroid and Tessalon Perles.  Advised to return if symptoms do not improve or seem to be worsening at home.  All patient questions were answered.    ____________________________________________  FINAL CLINICAL IMPRESSION(S) / ED DIAGNOSES  Final diagnoses:  Cough      NEW MEDICATIONS STARTED DURING THIS VISIT:  ED Discharge Orders          Ordered    predniSONE (STERAPRED UNI-PAK 21 TAB) 10 MG (21) TBPK tablet        08/27/20 1217    benzonatate (TESSALON PERLES) 100 MG capsule  3 times daily PRN        08/27/20 1217                This chart was dictated using voice recognition software/Dragon. Despite best efforts to proofread, errors can occur which can change the meaning. Any change was purely unintentional.     Lannie Fields, PA-C 08/27/20 1250

## 2020-08-27 NOTE — Discharge Instructions (Addendum)
You can take tapered steroid as directed.  You have been prescribed Tessalon Perles for cough.

## 2020-08-28 LAB — SARS CORONAVIRUS 2 (TAT 6-24 HRS): SARS Coronavirus 2: NEGATIVE

## 2020-10-17 ENCOUNTER — Emergency Department (HOSPITAL_BASED_OUTPATIENT_CLINIC_OR_DEPARTMENT_OTHER): Payer: Self-pay | Admitting: Radiology

## 2020-10-17 ENCOUNTER — Emergency Department (HOSPITAL_BASED_OUTPATIENT_CLINIC_OR_DEPARTMENT_OTHER)
Admission: EM | Admit: 2020-10-17 | Discharge: 2020-10-17 | Disposition: A | Discharge status: Home / Self Care | Payer: Self-pay | Attending: Emergency Medicine | Admitting: Emergency Medicine

## 2020-10-17 ENCOUNTER — Encounter (HOSPITAL_BASED_OUTPATIENT_CLINIC_OR_DEPARTMENT_OTHER): Payer: Self-pay | Admitting: Obstetrics and Gynecology

## 2020-10-17 ENCOUNTER — Other Ambulatory Visit: Payer: Self-pay

## 2020-10-17 DIAGNOSIS — R079 Chest pain, unspecified: Secondary | ICD-10-CM

## 2020-10-17 DIAGNOSIS — Z20822 Contact with and (suspected) exposure to covid-19: Secondary | ICD-10-CM | POA: Insufficient documentation

## 2020-10-17 DIAGNOSIS — J45909 Unspecified asthma, uncomplicated: Secondary | ICD-10-CM | POA: Insufficient documentation

## 2020-10-17 DIAGNOSIS — R0789 Other chest pain: Secondary | ICD-10-CM | POA: Insufficient documentation

## 2020-10-17 DIAGNOSIS — F17210 Nicotine dependence, cigarettes, uncomplicated: Secondary | ICD-10-CM | POA: Insufficient documentation

## 2020-10-17 LAB — D-DIMER, QUANTITATIVE: D-Dimer, Quant: <0.27 ug/mL-FEU (ref 0.00–0.50)

## 2020-10-17 LAB — TROPONIN I (HIGH SENSITIVITY)
Troponin I (High Sensitivity): 3 ng/L (ref <18)
Troponin I (High Sensitivity): 5 ng/L (ref <18)

## 2020-10-17 LAB — RESP PANEL BY RT-PCR (FLU A&B, COVID) ARPGX2
Influenza A by PCR: NEGATIVE
Influenza B by PCR: NEGATIVE
SARS Coronavirus 2 by RT PCR: NEGATIVE

## 2020-10-17 LAB — CBC
HCT: 48.3 % (ref 39.0–52.0)
Hemoglobin: 16.7 g/dL (ref 13.0–17.0)
MCH: 30.6 pg (ref 26.0–34.0)
MCHC: 34.6 g/dL (ref 30.0–36.0)
MCV: 88.5 fL (ref 80.0–100.0)
Platelets: 303 10*3/uL (ref 150–400)
RBC: 5.46 MIL/uL (ref 4.22–5.81)
RDW: 12.9 % (ref 11.5–15.5)
WBC: 11.9 10*3/uL — ABNORMAL HIGH (ref 4.0–10.5)
nRBC: 0 % (ref 0.0–0.2)

## 2020-10-17 LAB — BASIC METABOLIC PANEL
Anion gap: 12 (ref 5–15)
BUN: 14 mg/dL (ref 6–20)
CO2: 24 mmol/L (ref 22–32)
Calcium: 10.3 mg/dL (ref 8.9–10.3)
Chloride: 102 mmol/L (ref 98–111)
Creatinine, Ser: 0.96 mg/dL (ref 0.61–1.24)
GFR, Estimated: >60 mL/min (ref >60)
Glucose, Bld: 89 mg/dL (ref 70–99)
Potassium: 3.6 mmol/L (ref 3.5–5.1)
Sodium: 138 mmol/L (ref 135–145)

## 2020-10-17 NOTE — ED Provider Notes (Signed)
[facility] Provider Note  CSN: [id number] Arrival date & time: 10/17/20 1146    History Chief Complaint  Patient presents with  . Chest Pain    [name] is a 30 y.o. male with no significant PMH reports onset of midsternal chest pain this morning while at work. He works for a [employer], [employer]/video. Last had a long distance move about 2 weeks ago but had been resting after that. He reports general malaise the last few days but chest pain just started this morning. He has had some nausea, not sleeping well, hot flashes. He denies any fever, cough. He reports pain has since resolved. He admits to tobacco and alcohol use. Denies any HTN, DM, HLD.    Past Medical History:  Diagnosis Date  . Anxiety    no current med.  . Asthma    triggered by cold weather; prn inhaler  . Depression   . Fracture of metacarpal 02/13/2012   right small  . Psoriasis    right foot    Past Surgical History:  Procedure Laterality Date  . CLOSED REDUCTION FINGER WITH PERCUTANEOUS PINNING  02/16/2012   Procedure: CLOSED REDUCTION FINGER WITH PERCUTANEOUS PINNING;  Surgeon: Schuyler Amor, MD;  Location: [facility];  Service: Orthopedics;  Laterality: Right;  CLOSED REDUCTION PINNING RT SMALL METACARPAL FX.  Marland Kitchen PERCUTANEOUS PINNING Right 06/20/2019   Procedure: RIGHT SCAPHOID PERCUTANEOUS FIXATION;  Surgeon: Leandrew Koyanagi, MD;  Location: [facility];  Service: Orthopedics;  Laterality: Right;  MAC  . TONSILLECTOMY  age 1    Family History  Problem Relation Age of Onset  . Healthy Mother   . Healthy Father   . COPD Maternal Grandmother     Social History   Tobacco Use  . Smoking status: Every Day    Packs/day: 1.00    Years: 18.00    Pack years: 18.00    Types: Cigarettes  . Smokeless tobacco: Never  Vaping Use  . Vaping Use: Never used  Substance Use Topics  . Alcohol use: Yes    Comment: "couple times a week" "1-6 beers" last  drink was on Tuesday. 06/07/14  . Drug use: Yes    Types: Marijuana     Home Medications Prior to Admission medications   Medication Sig Start Date End Date Taking? Authorizing Provider  cetirizine (ZYRTEC) 10 MG tablet Take 10 mg by mouth daily.    [provider]  ondansetron (ZOFRAN) 4 MG tablet Take 1-2 tablets (4-8 mg total) by mouth every 8 (eight) hours as needed for nausea or vomiting. 06/20/19   Leandrew Koyanagi, MD  oxyCODONE-acetaminophen (PERCOCET) 5-325 MG tablet Take 1-2 tablets by mouth 2 (two) times daily as needed for severe pain. 06/28/19   Aundra Dubin, PA-C  predniSONE (STERAPRED UNI-PAK 21 TAB) 10 MG (21) TBPK tablet 6,5,4,3,2,1 08/27/20   Vallarie Mare M, PA-C  gabapentin (NEURONTIN) 300 MG capsule 1 tab po at bedtime 1st day, 1 tablet bid second day, then 1 tablet tid 10/23/15 06/09/19  Janne Napoleon, NP  venlafaxine XR (EFFEXOR-XR) 37.5 MG 24 hr capsule Take 1 capsule (37.5 mg total) by mouth daily with breakfast. 06/10/14 06/09/19  Kerrie Buffalo, NP     Allergies    Patient has no known allergies.   Review of Systems   Review of Systems A comprehensive review of systems was completed and negative except as noted in HPI.    Physical Exam BP 121/84 (BP  Location: Right Arm)   Pulse (!) 58   Temp 98.3 F (36.8 C)   Resp 18   Ht _0  (1.676 m)   Wt 56.7 kg   SpO2 98%   BMI 20.18 kg/m   Physical Exam Vitals and nursing note reviewed.  Constitutional:      Appearance: Normal appearance.  HENT:     Head: Normocephalic and atraumatic.     Nose: Nose normal.     Mouth/Throat:     Mouth: Mucous membranes are moist.  Eyes:     Extraocular Movements: Extraocular movements intact.     Conjunctiva/sclera: Conjunctivae normal.  Cardiovascular:     Rate and Rhythm: Normal rate.  Pulmonary:     Effort: Pulmonary effort is normal.     Breath sounds: Normal breath sounds.  Chest:     Chest wall: No tenderness.  Abdominal:     General: Abdomen is flat.      Palpations: Abdomen is soft.     Tenderness: There is no abdominal tenderness.  Musculoskeletal:        General: No swelling. Normal range of motion.     Cervical back: Neck supple.  Skin:    General: Skin is warm and dry.  Neurological:     General: No focal deficit present.     Mental Status: He is alert.  Psychiatric:        Mood and Affect: Mood normal.     ED Results / Procedures / Treatments   Labs (all labs ordered are listed, but only abnormal results are displayed) Labs Reviewed  CBC - Abnormal; Notable for the following components:      Result Value   WBC 11.9 (*)    All other components within normal limits  RESP PANEL BY RT-PCR (FLU A&B, COVID) ARPGX2  BASIC METABOLIC PANEL  TROPONIN I (HIGH SENSITIVITY)  TROPONIN I (HIGH SENSITIVITY)    EKG EKG Interpretation  Date/Time:  Wednesday October 17 2020 11:51:59 EDT Ventricular Rate:  77 PR Interval:  166 QRS Duration: 84 QT Interval:  360 QTC Calculation: 407 R Axis:   95 Text Interpretation: Sinus rhythm with marked sinus arrhythmia Biatrial enlargement Rightward axis Pulmonary disease pattern Abnormal ECG Since last tracing inferior TWI has resolved. Confirmed by Calvert Cantor [phone]) on 10/17/2020 12:13:39 PM   Radiology DG Chest 2 View  Result Date: 10/17/2020 CLINICAL DATA:  Chest pain EXAM: CHEST - 2 VIEW COMPARISON:  05/14/2015 FINDINGS: The heart size and mediastinal contours are within normal limits. Both lungs are clear. The visualized skeletal structures are unremarkable. IMPRESSION: Normal chest radiographs. Electronically Signed   By: Davina Poke D.O.   On: 10/17/2020 12:37    Procedures Procedures  Medications Ordered in the ED Medications - No data to display   MDM Rules/Calculators/A&P MDM Patient with atypical chest pain, has also had general malaise recently. CXR, first Trop, CBC and BMP are normal. Will add dimer due to his long distance a travel, check Covid and second Trop.    ED Course  I have reviewed the triage vital signs and the nursing notes.  Pertinent labs & imaging results that were available during my care of the patient were reviewed by me and considered in my medical decision making (see chart for details).  Clinical Course as of 10/18/20 0656  Wed Oct 17, 2020  1520 Care of the patient signed out to Dr. Fulton Reek at the change of shift.  [CS]    Clinical Course User  Index [CS] Truddie Hidden, MD    Final Clinical Impression(s) / ED Diagnoses Final diagnoses:  None    Rx / DC Orders ED Discharge Orders     None        Truddie Hidden, MD 10/18/20 [phone]

## 2020-10-17 NOTE — ED Triage Notes (Signed)
Patient reports to the ER for chest pain. Patient repots substernal/epigastric area pain today. Patient denies N/V but endorses some ShoB and diarrhea

## 2020-10-17 NOTE — ED Notes (Signed)
At work began feeling exhausted and developed chest pain, states lately has not been able sleep well, having hot flashes. States was moving furniture and developed chest pressure mid sternal, non radiating, had some nausea with this chest pain, does states he has nausea frequently.

## 2020-10-17 NOTE — ED Provider Notes (Signed)
Patient signed out to me by previous provider. Please refer to their note for full HPI.  Briefly this is a 30 year old male who developed chest pain while at work today.  EKG is nonischemic, first troponin is negative.  Chest x-ray is reassuring.  We are pending repeat troponin and D-dimer. Physical Exam  BP 128/83   Pulse 67   Temp 98.3 F (36.8 C)   Resp 16   Ht 5\' 6"  (1.676 m)   Wt 56.7 kg   SpO2 100%   BMI 20.18 kg/m   Physical Exam Vitals and nursing note reviewed.  Constitutional:      Appearance: Normal appearance.  HENT:     Head: Normocephalic.     Mouth/Throat:     Mouth: Mucous membranes are moist.  Cardiovascular:     Rate and Rhythm: Normal rate.  Pulmonary:     Effort: Pulmonary effort is normal. No respiratory distress.  Abdominal:     Palpations: Abdomen is soft.     Tenderness: There is no abdominal tenderness.  Skin:    General: Skin is warm.  Neurological:     Mental Status: He is alert and oriented to person, place, and time. Mental status is at baseline.  Psychiatric:        Mood and Affect: Mood normal.    ED Course/Procedures   Clinical Course as of 10/17/20 1657  Wed Oct 17, 2020  1520 Care of the patient signed out to Dr. Oct 19, 2020 at the change of shift.  [CS]    Clinical Course User Index [CS] Hyman Bower, MD    Procedures  MDM   D-dimer is negative, repeat troponin is negative with no significant delta.  Patient on my reevaluation is chest pain-free.  Otherwise young and healthy.  No need for any further emergent testing/admission.  Recommended outpatient follow-up.  Patient at this time appears safe and stable for discharge and will be treated as an outpatient.  Discharge plan and strict return to ED precautions discussed, patient verbalizes understanding and agreement.       Pollyann Savoy, DO 10/17/20 1658

## 2020-10-17 NOTE — Discharge Instructions (Addendum)
You have been seen and discharged from the emergency department.  Your heart and lung work-up was normal.  Follow-up with your primary provider for reevaluation and further care. Take home medications as prescribed. If you have any worsening symptoms or further concerns for your health please return to an emergency department for further evaluation. 

## 2020-10-17 NOTE — ED Notes (Signed)
Pt noted upon physical exam to have Pectus Excavatum

## 2022-06-25 ENCOUNTER — Ambulatory Visit (HOSPITAL_COMMUNITY)
Admission: EM | Admit: 2022-06-25 | Discharge: 2022-06-25 | Disposition: A | Discharge status: Home / Self Care | Payer: PRIVATE HEALTH INSURANCE | Attending: Internal Medicine | Admitting: Internal Medicine

## 2022-06-25 ENCOUNTER — Encounter (HOSPITAL_COMMUNITY): Payer: Self-pay

## 2022-06-25 ENCOUNTER — Ambulatory Visit (INDEPENDENT_AMBULATORY_CARE_PROVIDER_SITE_OTHER): Payer: PRIVATE HEALTH INSURANCE

## 2022-06-25 DIAGNOSIS — M545 Low back pain, unspecified: Secondary | ICD-10-CM | POA: Diagnosis not present

## 2022-06-25 MED ORDER — KETOROLAC TROMETHAMINE 30 MG/ML IJ SOLN
INTRAMUSCULAR | Status: AC
  Filled 2022-06-25: qty 1

## 2022-06-25 MED ORDER — KETOROLAC TROMETHAMINE 30 MG/ML IJ SOLN
30.0000 mg | Freq: Once | INTRAMUSCULAR | Status: AC
  Administered 2022-06-25: 30 mg via INTRAMUSCULAR

## 2022-06-25 MED ORDER — MELOXICAM 15 MG PO TABS: 15.0000 mg | ORAL_TABLET | Freq: Every day | ORAL | 0 refills | Status: AC

## 2022-06-25 MED ORDER — METHYLPREDNISOLONE SODIUM SUCC 125 MG IJ SOLR
80.0000 mg | Freq: Once | INTRAMUSCULAR | Status: AC
  Administered 2022-06-25: 80 mg via INTRAMUSCULAR

## 2022-06-25 MED ORDER — METHYLPREDNISOLONE SODIUM SUCC 125 MG IJ SOLR
INTRAMUSCULAR | Status: AC
  Filled 2022-06-25: qty 2

## 2022-06-25 NOTE — ED Provider Notes (Signed)
[facility]   [id number] 06/25/22 Arrival Time: 0804  ASSESSMENT & PLAN:  X-rays of lumbar spine and sacrum negative for fracture  1. Acute bilateral low back pain without sciatica    -Reassurance was provided to the patient do not have any fractures seen on his x-rays today.  I will treat him symptomatically for musculoskeletal low back pain with IM injections of Toradol and Solu-Medrol.  Also called in a prescription for meloxicam 15 mg daily.  Recommended he ice and rest over the next few days.  Work note given.  All his questions answered and he agreed with the plan.  Meds ordered this encounter  Medications   meloxicam (MOBIC) 15 MG tablet    Sig: Take 1 tablet (15 mg total) by mouth daily.    Dispense:  30 tablet    Refill:  0   ketorolac (TORADOL) 30 MG/ML injection 30 mg   methylPREDNISolone sodium succinate (SOLU-MEDROL) 125 mg/2 mL injection 80 mg     Discharge Instructions      Ice for 20 mins at a time Take meloxicam 1x daily        Reviewed expectations re: course of current medical issues. Questions answered. Outlined signs and symptoms indicating need for more acute intervention. Patient verbalized understanding. After Visit Summary given.   SUBJECTIVE: Pleasant 32 year old male comes to urgent care to evaluated for low back pain.  He was at a concert last night and attempted to crowd surf when he jumped off the stage and landed on his back on the concrete.  He fell directly over his lumbar spine and sacrum area.  He is very sore at his low back now.  He is not take any medicines.  He denies any numbness or tingling or weakness in the legs.  No loss of bowel or bladder control.  No LMP for male patient. Past Surgical History:  Procedure Laterality Date   CLOSED REDUCTION FINGER WITH PERCUTANEOUS PINNING  02/16/2012   Procedure: CLOSED REDUCTION FINGER WITH PERCUTANEOUS PINNING;  Surgeon: Marlowe Shores, MD;  Location: [facility];  Service: Orthopedics;  Laterality: Right;  CLOSED REDUCTION PINNING RT SMALL METACARPAL FX.   PERCUTANEOUS PINNING Right 06/20/2019   Procedure: RIGHT SCAPHOID PERCUTANEOUS FIXATION;  Surgeon: Tarry Kos, MD;  Location: [facility];  Service: Orthopedics;  Laterality: Right;  MAC   TONSILLECTOMY  age 28     OBJECTIVE:  Vitals:   06/25/22 0840  BP: 125/75  Pulse: 93  Resp: 16  Temp: 98.1 F (36.7 C)  TempSrc: Oral  SpO2: 96%     Physical Exam Vitals reviewed.  Constitutional:      Appearance: Normal appearance.  Cardiovascular:     Rate and Rhythm: Normal rate.  Pulmonary:     Effort: Pulmonary effort is normal.  Musculoskeletal:     Comments: Lumbar spine -tender to palpation at multilevel lumbar spinous processes and paraspinal musculature bilaterally.  Tender to palpation in right SI joint.  Able to ambulate with antalgic gait.  Neurovascular intact distally.      Labs: Results for orders placed or performed during the hospital encounter of 10/17/20  Resp Panel by RT-PCR (Flu A&B, Covid) Nasopharyngeal Swab   Specimen: Nasopharyngeal Swab; Nasopharyngeal(NP) swabs in vial transport medium  Result Value Ref Range   SARS Coronavirus 2 by RT PCR NEGATIVE NEGATIVE   Influenza A by PCR NEGATIVE NEGATIVE   Influenza B by PCR NEGATIVE NEGATIVE  Basic metabolic panel  Result Value Ref Range   Sodium 138 135 - 145 mmol/L   Potassium 3.6 3.5 - 5.1 mmol/L   Chloride 102 98 - 111 mmol/L   CO2 24 22 - 32 mmol/L   Glucose, Bld 89 70 - 99 mg/dL   BUN 14 6 - 20 mg/dL   Creatinine, Ser 4.09 0.61 - 1.24 mg/dL   Calcium 81.1 8.9 - 91.4 mg/dL   GFR, Estimated >78 >29 mL/min   Anion gap 12 5 - 15  CBC  Result Value Ref Range   WBC 11.9 (H) 4.0 - 10.5 K/uL   RBC 5.46 4.22 - 5.81 MIL/uL   Hemoglobin 16.7 13.0 - 17.0 g/dL   HCT 56.2 13.0 - 86.5 %   MCV 88.5 80.0 - 100.0 fL   MCH 30.6 26.0 - 34.0 pg   MCHC 34.6 30.0 - 36.0 g/dL   RDW 78.4 69.6 - 29.5 %    Platelets 303 150 - 400 K/uL   nRBC 0.0 0.0 - 0.2 %  D-dimer, quantitative  Result Value Ref Range   D-Dimer, Quant <0.27 0.00 - 0.50 ug/mL-FEU  Troponin I (High Sensitivity)  Result Value Ref Range   Troponin I (High Sensitivity) 3 <18 ng/L  Troponin I (High Sensitivity)  Result Value Ref Range   Troponin I (High Sensitivity) 5 <18 ng/L   Labs Reviewed - No data to display  Imaging: DG Sacrum/Coccyx  Result Date: 06/25/2022 CLINICAL DATA:  Low back pain after fall last night. EXAM: SACRUM AND COCCYX - 2+ VIEW COMPARISON:  None Available. FINDINGS: There is no evidence of fracture or other focal bone lesions. IMPRESSION: Negative. Electronically Signed   By: Lupita Raider M.D.   On: 06/25/2022 09:15   DG Lumbar Spine Complete  Result Date: 06/25/2022 CLINICAL DATA:  Low back pain after fall last night. EXAM: LUMBAR SPINE - COMPLETE 4+ VIEW COMPARISON:  None Available. FINDINGS: There is no evidence of lumbar spine fracture. Alignment is normal. Intervertebral disc spaces are maintained. IMPRESSION: Negative. Electronically Signed   By: Lupita Raider M.D.   On: 06/25/2022 09:14     No Known Allergies                                             Past Medical History:  Diagnosis Date   Anxiety    no current med.   Asthma    triggered by cold weather; prn inhaler   Depression    Fracture of metacarpal 02/13/2012   right small   Psoriasis    right foot    Social History   Socioeconomic History   Marital status: Single    Spouse name: Not on file   Number of children: Not on file   Years of education: Not on file   Highest education level: Not on file  Occupational History   Not on file  Tobacco Use   Smoking status: Every Day    Packs/day: 1.00    Years: 18.00    Additional pack years: 0.00    Total pack years: 18.00    Types: Cigarettes   Smokeless tobacco: Never  Vaping Use   Vaping Use: Never used  Substance and Sexual Activity   Alcohol use: Yes     Comment: "couple times a week" "1-6 beers" last drink was on Tuesday. 06/07/14   Drug use: Not Currently  Sexual activity: Yes  Other Topics Concern   Not on file  Social History Narrative   Not on file   Social Determinants of Health   Financial Resource Strain: Not on file  Food Insecurity: Not on file  Transportation Needs: Not on file  Physical Activity: Not on file  Stress: Not on file  Social Connections: Not on file  Intimate Partner Violence: Not on file    Family History  Problem Relation Age of Onset   Healthy Mother    Healthy Father    COPD Maternal Grandmother       Mallori Araque, Baldemar Friday, MD 06/25/22 [phone]

## 2022-06-25 NOTE — Discharge Instructions (Addendum)
Ice for 20 mins at a time Take meloxicam 1x daily

## 2022-06-25 NOTE — ED Triage Notes (Signed)
Pt states jumped off a stage last night and fell 65ft on his back on concrete floor. Pt c/o lower back pain radiating to lt buttocks. Denies incontinence of urine or bowel. Denies taking any meds.
